# Patient Record
Sex: Male | Born: 1955 | Race: White | Hispanic: No | Smoking: Former smoker
Health system: Southern US, Community
[De-identification: ages and names within clinical notes are randomized; demographics above are authoritative.]

## PROBLEM LIST (undated history)

## (undated) DIAGNOSIS — M109 Gout, unspecified: Secondary | ICD-10-CM

## (undated) DIAGNOSIS — C439 Malignant melanoma of skin, unspecified: Secondary | ICD-10-CM

## (undated) DIAGNOSIS — K219 Gastro-esophageal reflux disease without esophagitis: Secondary | ICD-10-CM

## (undated) DIAGNOSIS — I10 Essential (primary) hypertension: Secondary | ICD-10-CM

## (undated) HISTORY — DX: Malignant melanoma of skin, unspecified: C43.9

## (undated) HISTORY — DX: Essential (primary) hypertension: I10

## (undated) HISTORY — DX: Gout, unspecified: M10.9

---

## 2008-05-14 ENCOUNTER — Ambulatory Visit: Payer: Self-pay | Admitting: Gastroenterology

## 2009-02-05 ENCOUNTER — Ambulatory Visit: Payer: Self-pay | Admitting: Internal Medicine

## 2010-04-11 HISTORY — PX: UPPER GI ENDOSCOPY: SHX6162

## 2010-04-11 HISTORY — PX: COLONOSCOPY: SHX174

## 2010-11-22 ENCOUNTER — Ambulatory Visit: Payer: Self-pay

## 2011-06-08 ENCOUNTER — Ambulatory Visit: Payer: Self-pay | Admitting: Gastroenterology

## 2011-06-10 LAB — PATHOLOGY REPORT

## 2012-02-06 ENCOUNTER — Ambulatory Visit: Payer: Self-pay | Admitting: Internal Medicine

## 2012-04-02 ENCOUNTER — Ambulatory Visit: Payer: Self-pay | Admitting: Podiatry

## 2014-03-17 ENCOUNTER — Ambulatory Visit: Payer: Self-pay | Admitting: Family Medicine

## 2014-04-11 DIAGNOSIS — C439 Malignant melanoma of skin, unspecified: Secondary | ICD-10-CM

## 2014-04-11 HISTORY — DX: Malignant melanoma of skin, unspecified: C43.9

## 2014-04-11 HISTORY — PX: MELANOMA EXCISION: SHX5266

## 2015-07-22 ENCOUNTER — Other Ambulatory Visit (INDEPENDENT_AMBULATORY_CARE_PROVIDER_SITE_OTHER): Payer: PRIVATE HEALTH INSURANCE

## 2015-07-22 ENCOUNTER — Ambulatory Visit (INDEPENDENT_AMBULATORY_CARE_PROVIDER_SITE_OTHER): Payer: PRIVATE HEALTH INSURANCE | Admitting: Family Medicine

## 2015-07-22 ENCOUNTER — Encounter: Payer: Self-pay | Admitting: Family Medicine

## 2015-07-22 VITALS — BP 136/82 | HR 72 | Wt 201.0 lb

## 2015-07-22 DIAGNOSIS — S86001A Unspecified injury of right Achilles tendon, initial encounter: Secondary | ICD-10-CM

## 2015-07-22 DIAGNOSIS — M1A0710 Idiopathic chronic gout, right ankle and foot, without tophus (tophi): Secondary | ICD-10-CM | POA: Diagnosis not present

## 2015-07-22 DIAGNOSIS — M109 Gout, unspecified: Secondary | ICD-10-CM | POA: Insufficient documentation

## 2015-07-22 NOTE — Assessment & Plan Note (Signed)
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 Patient has made improvement since he's got up on his allopurinol to 300 mg. Encourage him to continue do this. Has colchicine for breakthrough. But given trial topical anti-inflammatory. Discussed the importance of the heel lift. Exercise prescription given to increase activity slowly. Follow up in 4 weeks to re-ultrasound to make sure completely resolved.

## 2015-07-22 NOTE — Patient Instructions (Signed)
Good to see yo u Sorry for the bad news but I do think you will get better Heel lift could still help Give 2 weeks more on staying off the treadmill then start 1 time a week for 2 weeks then increase slowly.  Watch the diet a little Vitamin D 2000 iU daily  Turmeric 500mg  twice daily  See me again in 4 weeks if not perfect

## 2015-07-22 NOTE — Progress Notes (Signed)
[name] [address] [place], [place] [address] Phone: [phone] Subjective:      CC: right ankle pain  [id number] Dwayne Hunt is a 60 y.o. male coming in with complaint of right ankle pain. Has had this for greater than 8 months. Has been seen in formal physical therapy 2 times, Stella walking boot, prednisone, and started on allopurinol recently. Has been titrated up on the allopurinol and is finally noticing some mild improvement. Patient states that the physical therapy did not help. Would have redness and warmth to the back of the heel. Even at times but keep him from walking. Rate in the severity of pain initially as 9 out of 10. Now more of 4 out of 10. Patient is an avid bowler and is looking to start going on a regular basis again. Has not responded to ice or heat. Now though able to do daily activities more regularly.     No past medical history on file.history of gout No past surgical history on file. Social History   Social History  . Marital Status: Divorced    Spouse Name: N/A  . Number of Children: N/A  . Years of Education: N/A   Social History Main Topics  . Smoking status: Not on file  . Smokeless tobacco: Not on file  . Alcohol Use: Not on file  . Drug Use: Not on file  . Sexual Activity: Not on file   Other Topics Concern  . Not on file   Social History Narrative  . No narrative on file   Allergies not on fileno known drug allergies No family history on file.positive family history of polymyositis  Past medical history, social, surgical and family history all reviewed in electronic medical record.  No pertanent information unless stated regarding to the chief complaint.   Review of Systems: No headache, visual changes, nausea, vomiting, diarrhea, constipation, dizziness, abdominal pain, skin rash, fevers, chills, night sweats, weight loss, swollen lymph nodes, body aches, joint swelling, muscle aches, chest pain,  shortness of breath, mood changes.   Objective Blood pressure 136/82, pulse 72, weight 201 lb (91.173 kg).  General: No apparent distress alert and oriented x3 mood and affect normal, dressed appropriately.  HEENT: Pupils equal, extraocular movements intact  Respiratory: Patient's speak in full sentences and does not appear short of breath  Cardiovascular: No lower extremity edema, non tender, no erythema  Skin: Warm dry intact with no signs of infection or rash on extremities or on axial skeleton.  Abdomen: Soft nontender  Neuro: Cranial nerves II through XII are intact, neurovascularly intact in all extremities with 2+ DTRs and 2+ pulses.  Lymph: No lymphadenopathy of posterior or anterior cervical chain or axillae bilaterally.  Gait normal with good balance and coordination.  MSK:  Non tender with full range of motion and good stability and symmetric strength and tone of shoulders, elbows, wrist, hip, knees bilaterally.  Ankle:right No visible erythema or swelling. Range of motion is full in all directions. Strength is 5/5 in all directions. Stable lateral and medial ligaments; squeeze test and kleiger test unremarkable; Talar dome nontender; No pain at base of 5th MT; No tenderness over cuboid; No tenderness over N spot or navicular prominence No tenderness on posterior aspects of lateral and medial malleolus No sign of peroneal tendon subluxations or tenderness to palpation Mild tenderness to palpation over the insertion of the Achilles and the right side Negative tarsal tunnel tinel's Able to walk 4  steps.  MSK US performed of: right ankle This study was ordered, performed, and interpreted by Charlann Boxer D.O.  Foot/Ankle:   All structures visualized.   Talar dome unremarkable  Ankle mortise without effusion. Peroneus longus and brevis tendons unremarkable on long and transverse views without sheath effusions. Posterior tibialis, flexor hallucis longus, and flexor digitorum  longus tendons unremarkable on long and transverse views without sheath effusions. Achilles tendon visualized with no significant nodule noted. No significant increase in Doppler flow. Uric acid crystals noted at insertion. Anterior Talofibular Ligament and Calcaneofibular Ligaments unremarkable and intact. Deltoid Ligament unremarkable and intact. Plantar fascia intact and without effusion, normal thickness. No increased doppler signal, cap sign, or thickening of tibial cortex. Power doppler signal normal.  IMPRESSION: uric acid crystals in tendon sheath     Impression and Recommendations:     This case required medical decision making of moderate complexity.      Note: This dictation was prepared with Dragon dictation along with smaller phrase technology. Any transcriptional errors that result from this process are unintentional.

## 2015-08-18 ENCOUNTER — Encounter: Payer: Self-pay | Admitting: Family Medicine

## 2015-08-18 ENCOUNTER — Ambulatory Visit (INDEPENDENT_AMBULATORY_CARE_PROVIDER_SITE_OTHER): Payer: PRIVATE HEALTH INSURANCE | Admitting: Family Medicine

## 2015-08-18 VITALS — BP 120/80 | HR 60 | Wt 204.0 lb

## 2015-08-18 DIAGNOSIS — M1A0710 Idiopathic chronic gout, right ankle and foot, without tophus (tophi): Secondary | ICD-10-CM | POA: Diagnosis not present

## 2015-08-18 NOTE — Progress Notes (Signed)
Pre visit review using our clinic review tool, if applicable. No additional management support is needed unless otherwise documented below in the visit note. 

## 2015-08-18 NOTE — Progress Notes (Signed)
[facility] [address] [place], [place] [address] Phone: [phone] Subjective:      CC: right ankle pain f/u  [id number] Dwayne Hunt is a 60 y.o. male coming in with complaint of right ankle pain. This was found to have gout. Since he's been on the allopurinol significant a better. Patient went on a trip to [place] and did do a little alcohol and seafood which did cause a flare in his left ankle. Overall though not as severe as what is been. As long as he takes his medicine in the colchicine on an as-needed basis seems to be doing well. Continues to heal this. Starting to increase his activity at the gym as well.     No past medical history on file.history of gout No past surgical history on file. Social History   Social History  . Marital Status: Divorced    Spouse Name: N/A  . Number of Children: N/A  . Years of Education: N/A   Social History Main Topics  . Smoking status: Unknown If Ever Smoked  . Smokeless tobacco: None  . Alcohol Use: None  . Drug Use: None  . Sexual Activity: Not Asked   Other Topics Concern  . None   Social History Narrative   Not on [place] known drug allergies No family history on file.positive family history of polymyositis  Past medical history, social, surgical and family history all reviewed in electronic medical record.  No pertanent information unless stated regarding to the chief complaint.   Review of Systems: No headache, visual changes, nausea, vomiting, diarrhea, constipation, dizziness, abdominal pain, skin rash, fevers, chills, night sweats, weight loss, swollen lymph nodes, body aches, joint swelling, muscle aches, chest pain, shortness of breath, mood changes.   Objective Blood pressure 120/80, pulse 60, weight 204 lb (92.534 kg), SpO2 98 %.  General: No apparent distress alert and oriented x3 mood and affect normal, dressed appropriately.  HEENT: Pupils equal, extraocular movements intact    Respiratory: Patient's speak in full sentences and does not appear short of breath  Cardiovascular: No lower extremity edema, non tender, no erythema  Skin: Warm dry intact with no signs of infection or rash on extremities or on axial skeleton.  Abdomen: Soft nontender  Neuro: Cranial nerves II through XII are intact, neurovascularly intact in all extremities with 2+ DTRs and 2+ pulses.  Lymph: No lymphadenopathy of posterior or anterior cervical chain or axillae bilaterally.  Gait normal with good balance and coordination.  MSK:  Non tender with full range of motion and good stability and symmetric strength and tone of shoulders, elbows, wrist, hip, knees bilaterally.  Ankle:right No visible erythema or swelling. Range of motion is full in all directions. Strength is 5/5 in all directions. Stable lateral and medial ligaments; squeeze test and kleiger test unremarkable; Talar dome nontender; No pain at base of 5th MT; No tenderness over cuboid; No tenderness over N spot or navicular prominence No tenderness on posterior aspects of lateral and medial malleolus No sign of peroneal tendon subluxations or tenderness to palpation Minimal tenderness to palpation of the Achilles tendon bilaterally Negative tarsal tunnel tinel's Able to walk 4 steps.     Impression and Recommendations:     This case required medical decision making of moderate complexity.      Note: This dictation was prepared with Dragon dictation along with smaller phrase technology. Any transcriptional errors that result from this process are unintentional.

## 2015-08-18 NOTE — Assessment & Plan Note (Signed)
Patient did have improvement. Patient has anti-inflammatories for breakthrough pain. Continue allopurinol daily and we will refill as needed. We discussed the colchicine for breakthrough. Patient will see me again on an as-needed basis.

## 2015-08-18 NOTE — Patient Instructions (Signed)
Verbal instructions given

## 2015-09-17 DIAGNOSIS — N138 Other obstructive and reflux uropathy: Secondary | ICD-10-CM | POA: Insufficient documentation

## 2015-09-17 DIAGNOSIS — R7989 Other specified abnormal findings of blood chemistry: Secondary | ICD-10-CM | POA: Insufficient documentation

## 2015-09-17 DIAGNOSIS — N401 Enlarged prostate with lower urinary tract symptoms: Secondary | ICD-10-CM

## 2015-09-17 DIAGNOSIS — N5203 Combined arterial insufficiency and corporo-venous occlusive erectile dysfunction: Secondary | ICD-10-CM | POA: Insufficient documentation

## 2015-10-30 ENCOUNTER — Ambulatory Visit: Payer: PRIVATE HEALTH INSURANCE | Admitting: Family Medicine

## 2015-11-03 NOTE — Progress Notes (Signed)
[name] [place], [place] [address] Phone: [phone] Subjective:      CC: right ankle pain f/u  [id number]  Dwayne Hunt is a 60 y.o. male coming in with complaint of right ankle pain. This was found to have gout. Since he's been on the allopurinol significant a better. Patient was on allopurinol 3 mg. Patient was to do home exercises, bracing and we also discussed proper shoes. Patient states still has some mild pain bilaterally. Patient states that he can only wear his Platelets. Continues to try to increase his activity but is unable to do so. Does bike as well as elliptical without any significant pain.     No past medical history on file.history of gout No past surgical history on file. Social History   Social History  . Marital status: Divorced    Spouse name: N/A  . Number of children: N/A  . Years of education: N/A   Social History Main Topics  . Smoking status: Unknown If Ever Smoked  . Smokeless tobacco: None  . Alcohol use None  . Drug use: Unknown  . Sexual activity: Not Asked   Other Topics Concern  . None   Social History Narrative  . None   Not on [place] known drug allergies No family history on file.positive family history of polymyositis  Past medical history, social, surgical and family history all reviewed in electronic medical record.  No pertanent information unless stated regarding to the chief complaint.   Review of Systems: No headache, visual changes, nausea, vomiting, diarrhea, constipation, dizziness, abdominal pain, skin rash, fevers, chills, night sweats, weight loss, swollen lymph nodes, body aches, joint swelling, muscle aches, chest pain, shortness of breath, mood changes.   Objective  Blood pressure 122/82, pulse 64, height 6' (1.829 m), weight 203 lb (92.1 kg), SpO2 97 %.  General: No apparent distress alert and oriented x3 mood and affect normal, dressed appropriately.  HEENT:  Pupils equal, extraocular movements intact  Respiratory: Patient's speak in full sentences and does not appear short of breath  Cardiovascular: No lower extremity edema, non tender, no erythema  Skin: Warm dry intact with no signs of infection or rash on extremities or on axial skeleton.  Abdomen: Soft nontender  Neuro: Cranial nerves II through XII are intact, neurovascularly intact in all extremities with 2+ DTRs and 2+ pulses.  Lymph: No lymphadenopathy of posterior or anterior cervical chain or axillae bilaterally.  Gait normal with good balance and coordination.  MSK:  Non tender with full range of motion and good stability and symmetric strength and tone of shoulders, elbows, wrist, hip, knees bilaterally.  Ankle:right No visible erythema or swelling. Range of motion is full in all directions. Strength is 5/5 in all directions. Stable lateral and medial ligaments; squeeze test and kleiger test unremarkable; Talar dome nontender; No pain at base of 5th MT; No tenderness over cuboid; No tenderness over N spot or navicular prominence No tenderness on posterior aspects of lateral and medial malleolus No sign of peroneal tendon subluxations or tenderness to palpation Minimal tenderness to palpation of the Achilles tendon bilaterally, patient has mild tenderness over the left Achilles as well Negative tarsal tunnel tinel's Able to walk 4 steps.  Limited muscular skeletal ultrasound was performed and interpreted by Hulan [name], M  Limited ultrasound shows the patient still has some calcific first uric acid deposits that the Achilles tendon and its insertion. Still significantly better than previously.  Impression and Recommendations:     This case required medical decision making of moderate complexity.      Note: This dictation was prepared with Dragon dictation along with smaller phrase technology. Any transcriptional errors that result from this process are unintentional.

## 2015-11-03 NOTE — Assessment & Plan Note (Addendum)
Patient's has had difficulty with gout. Is on allopurinol. Most recent ultrasound shows patient still has some mild uric acid deposits noted on ultrasound. Patient will be continuing on the allopurinol we will add colchicine 0.6 mg daily. We discussed with patient about vitamin D supplementation. Patient will do once weekly for the next 12 weeks. We discussed icing regimen. Patient will try to make these changes as well as a heel lift in his shoe. Patient come back and see me again in 4 weeks. If continuing have difficulty we'll consider formal physical therapy as well as consider nitroglycerin patches patient would have to come off his Cialis.

## 2015-11-04 ENCOUNTER — Ambulatory Visit (INDEPENDENT_AMBULATORY_CARE_PROVIDER_SITE_OTHER): Payer: PRIVATE HEALTH INSURANCE | Admitting: Family Medicine

## 2015-11-04 ENCOUNTER — Other Ambulatory Visit: Payer: Self-pay

## 2015-11-04 ENCOUNTER — Encounter: Payer: Self-pay | Admitting: Family Medicine

## 2015-11-04 DIAGNOSIS — M1A0710 Idiopathic chronic gout, right ankle and foot, without tophus (tophi): Secondary | ICD-10-CM | POA: Diagnosis not present

## 2015-11-04 MED ORDER — VITAMIN D (ERGOCALCIFEROL) 1.25 MG (50000 UNIT) PO CAPS
50000.0000 [IU] | ORAL_CAPSULE | ORAL | 0 refills | Status: DC
Start: 2015-11-04 — End: 2016-02-24

## 2015-11-04 NOTE — Patient Instructions (Addendum)
Good to see you  Ice is your friend [email or web address] 1/8 inch or 1/4 inch heel lift would be great  Once weekly vitamin D  Colchicine 1 time daily always now Continue the allopurinol Push it in week 3  TENS unit 20 minutes 1 time daily at least  See me again in 3-4 weeks. If not a lot better we will consider PT and nitro patch

## 2015-12-01 NOTE — Progress Notes (Signed)
[facility] [place], [place] [address] Phone: [phone] Subjective:    CC: right ankle pain f/u  [id number]  Dwayne Hunt is a 60 y.o. male coming in with complaint of right ankle pain. This was found to have gout. Since he's been on the allopurinol significant a better. Patient was on allopurinol 300 mg. Patient was to do home exercises, bracing and we also discussed proper shoes.  Patient was making improvement. Patient was to do heel lift, once weekly vitamin D, colchicine regularly and start increasing activity. Patient states He is doing 90% better. Still some mild discomfort at the back of the Achilles but nothing severe. Patient states that overall doing much better. Patient states that when on a 15 mile bike ride and did do well    No past medical history on file.history of gout No past surgical history on file. Social History   Social History  . Marital status: Divorced    Spouse name: N/A  . Number of children: N/A  . Years of education: N/A   Social History Main Topics  . Smoking status: Unknown If Ever Smoked  . Smokeless tobacco: None  . Alcohol use None  . Drug use: Unknown  . Sexual activity: Not Asked   Other Topics Concern  . None   Social History Narrative  . None   Not on [place] known drug allergies No family history on file.positive family history of polymyositis  Past medical history, social, surgical and family history all reviewed in electronic medical record.  No pertanent information unless stated regarding to the chief complaint.   Review of Systems: No headache, visual changes, nausea, vomiting, diarrhea, constipation, dizziness, abdominal pain, skin rash, fevers, chills, night sweats, weight loss, swollen lymph nodes, body aches, joint swelling, muscle aches, chest pain, shortness of breath, mood changes.   Objective  Blood pressure 118/82, pulse 60, weight 202 lb (91.6 kg), SpO2 98 %.  General:  No apparent distress alert and oriented x3 mood and affect normal, dressed appropriately.  HEENT: Pupils equal, extraocular movements intact  Respiratory: Patient's speak in full sentences and does not appear short of breath  Cardiovascular: No lower extremity edema, non tender, no erythema  Skin: Warm dry intact with no signs of infection or rash on extremities or on axial skeleton.  Abdomen: Soft nontender  Neuro: Cranial nerves II through XII are intact, neurovascularly intact in all extremities with 2+ DTRs and 2+ pulses.  Lymph: No lymphadenopathy of posterior or anterior cervical chain or axillae bilaterally.  Gait normal with good balance and coordination.  MSK:  Non tender with full range of motion and good stability and symmetric strength and tone of shoulders, elbows, wrist, hip, knees bilaterally.  Ankle:right No visible erythema or swelling. Range of motion is full in all directions. Strength is 5/5 in all directions. Stable lateral and medial ligaments; squeeze test and kleiger test unremarkable; Talar dome nontender; No pain at base of 5th MT; No tenderness over cuboid; No tenderness over N spot or navicular prominence No tenderness on posterior aspects of lateral and medial malleolus No sign of peroneal tendon subluxations or tenderness to palpation Nontender over the Achilles. Still some mild tightness of the Achilles bilaterally. Negative tarsal tunnel tinel's Able to walk 4 steps.     Impression and Recommendations:     This case required medical decision making of moderate complexity.      Note: This dictation was prepared with Dragon dictation  along with smaller phrase technology. Any transcriptional errors that result from this process are unintentional.

## 2015-12-02 ENCOUNTER — Encounter: Payer: Self-pay | Admitting: Family Medicine

## 2015-12-02 ENCOUNTER — Ambulatory Visit (INDEPENDENT_AMBULATORY_CARE_PROVIDER_SITE_OTHER): Payer: PRIVATE HEALTH INSURANCE | Admitting: Family Medicine

## 2015-12-02 DIAGNOSIS — M1A0710 Idiopathic chronic gout, right ankle and foot, without tophus (tophi): Secondary | ICD-10-CM | POA: Diagnosis not present

## 2015-12-02 NOTE — Patient Instructions (Signed)
Great to see you  Dwayne Hunt is your friend Keep it up  See me again in 6-8 weeks.

## 2015-12-02 NOTE — Assessment & Plan Note (Signed)
Achilles tendinosis bilaterally. Has made significant progress. Did have some spurring of the left Achilles on ultrasound. We discussed with patient to continue the once weekly vitamin D, the other medications, and patient will follow-up on an as-needed basis. If worsening symptoms again consider injection but patient and will have to decrease activity significantly. Patient understands this and will follow-up as needed.

## 2016-01-20 ENCOUNTER — Ambulatory Visit: Payer: PRIVATE HEALTH INSURANCE | Admitting: Family Medicine

## 2016-02-10 HISTORY — PX: BIOPSY SHOULDER: PRO31

## 2016-02-24 ENCOUNTER — Inpatient Hospital Stay: Payer: Self-pay

## 2016-02-24 ENCOUNTER — Ambulatory Visit (INDEPENDENT_AMBULATORY_CARE_PROVIDER_SITE_OTHER): Payer: PRIVATE HEALTH INSURANCE | Admitting: General Surgery

## 2016-02-24 ENCOUNTER — Encounter: Payer: Self-pay | Admitting: General Surgery

## 2016-02-24 VITALS — BP 128/74 | HR 62 | Resp 12 | Ht 72.0 in | Wt 195.0 lb

## 2016-02-24 DIAGNOSIS — Z85820 Personal history of malignant melanoma of skin: Secondary | ICD-10-CM

## 2016-02-24 DIAGNOSIS — C4361 Malignant melanoma of right upper limb, including shoulder: Secondary | ICD-10-CM | POA: Diagnosis not present

## 2016-02-24 DIAGNOSIS — C439 Malignant melanoma of skin, unspecified: Secondary | ICD-10-CM

## 2016-02-24 NOTE — Progress Notes (Addendum)
Patient ID: Dwayne Hunt, male   DOB: [birth year], 60 y.o.   MRN: [id number]  Chief Complaint  Patient presents with  . Cancer    melanoma    HPI Dwayne Hunt is a 60 y.o. male.  Here today for evaluation of melanoma of the right anterior shoulder. The biopsy was 02-10-16. He states he had melanoma on his left posterior shoulder last year as well. He works for [employer].  He has plans to go to [place] on vacation the near future.   The patient has previously had a melanoma excised from the left superior shoulder. He is examined every 6 months by the dermatology service.   He states his father passed away last month with chronic lymphocytic leukemia. . I personally reviewed the patient's history. HPI  Past Medical History:  Diagnosis Date  . Gout   . Hypertension   . Melanoma ([place]) 2016   left shoulder    Past Surgical History:  Procedure Laterality Date  . BIOPSY SHOULDER Right 02/10/2016   invasive melanoma  . COLONOSCOPY  2012  . MELANOMA EXCISION Left 2016   Dr Aubery Lapping  . UPPER GI ENDOSCOPY  2012    Family History  Problem Relation Age of Onset  . Ovarian cancer Mother   . Leukemia Father     Social History Social History  Substance Use Topics  . Smoking status: Former Smoker    Years: 10.00    Types: Cigarettes    Quit date: 04/11/2005  . Smokeless tobacco: Never Used  . Alcohol use 0.0 oz/week     Comment: 2/day    No Known Allergies  Current Outpatient Prescriptions  Medication Sig Dispense Refill  . allopurinol (ZYLOPRIM) 300 MG tablet Take 300 mg by mouth daily.  12  . ALPRAZolam (XANAX) 1 MG tablet Take 0.5 mg by mouth at bedtime as needed for anxiety.    . colchicine 0.6 MG tablet Take 0.6 mg by mouth as needed.    [name] (DEXILANT) 60 MG capsule Take by mouth.    [name] (PRINIVIL,ZESTRIL) 20 MG tablet Take 20 mg by mouth daily.  4  . tadalafil (CIALIS) 20 MG tablet     . zolpidem (AMBIEN) 10 MG tablet Take 10 mg by mouth  at bedtime as needed.  2   No current facility-administered medications for this visit.     Review of Systems Review of Systems  Constitutional: Negative.   Respiratory: Negative.   Cardiovascular: Negative.     Blood pressure 128/74, pulse 62, resp. rate 12, height 6' (1.829 m), weight 195 lb (88.5 kg), SpO2 95 %.  Physical Exam Physical Exam  Constitutional: He is oriented to person, place, and time. He appears well-developed and well-nourished.  HENT:  Mouth/Throat: Oropharynx is clear and moist.  Eyes: Conjunctivae are normal. No scleral icterus.  Neck: Neck supple.  Cardiovascular: Normal rate, regular rhythm and normal heart sounds.   Pulmonary/Chest: Effort normal and breath sounds normal.    Lymphadenopathy:    He has no cervical adenopathy.    He has axillary adenopathy (moderate enlargement without tenderness of the right axillary nodes is appreciated.).  Right axillary node  Neurological: He is alert and oriented to person, place, and time.  Skin: Skin is warm and dry.  Psychiatric: His behavior is normal.    Data Reviewed Dermatology notes of 02/10/2016 were reviewed.  Pathology from biopsy of the right anterior shoulder lesion showed an invasive malignant melanoma bras low thickness 0.29 mm.  No ulceration, mitoses or vascular invasion. Edges were involved with melanoma in situ. PT 1 a  The biopsy showed showed extensive melanoma in situ with rare superficial dermal component. Maximum thickness 0.37 mm.   Examination of the Axilla Was Completed Due To the Palpable Adenopathy. Multiple Moderately Enlarged Lymph Nodes Measuring up to 2.5 Cm in Diameter Were Noted. Normal Architecture Was Preserved. Likely Reactive Secondary to the Biopsy on the Shoulder As Well As the Right Inframammary Lesion. Contralateral Examination Showed Moderately Prominent Lymph Nodes, Slightly Smaller Than Those Noted on the Right Side.  Assessment    Early-stage malignant melanoma,  less than 0.4 mm.    Plan    The patient is a candidate for wide excision. Sentinel node biopsy is not indicated. He reports the extensive bleeding during the left shoulder excision.  We'll arrange for office excision after his return from his vacation in [place].     Recommend complete excision of the melanoma in the office after his trip to [place].   This information has been scribed by [name], BSN,BC.   [name] 02/24/2016, 7:56 PM

## 2016-02-24 NOTE — Patient Instructions (Addendum)
The patient is aware to call back for any questions or concerns. Recommend complete excision of the melanoma in the office after his trip to [place]

## 2016-03-22 ENCOUNTER — Ambulatory Visit (INDEPENDENT_AMBULATORY_CARE_PROVIDER_SITE_OTHER): Payer: PRIVATE HEALTH INSURANCE | Admitting: General Surgery

## 2016-03-22 ENCOUNTER — Encounter: Payer: Self-pay | Admitting: General Surgery

## 2016-03-22 VITALS — BP 120/70 | HR 60 | Resp 12 | Ht 72.0 in | Wt 199.0 lb

## 2016-03-22 DIAGNOSIS — C439 Malignant melanoma of skin, unspecified: Secondary | ICD-10-CM | POA: Diagnosis not present

## 2016-03-22 MED ORDER — HYDROCODONE-ACETAMINOPHEN 5-325 MG PO TABS: 1.0000 | ORAL_TABLET | ORAL | 0 refills | Status: DC | PRN

## 2016-03-22 NOTE — Patient Instructions (Addendum)
The patient is aware to call back for any questions or concerns. Keep area clean May shower  Use Ice pack for comfort today Return for suture removal

## 2016-03-22 NOTE — Progress Notes (Signed)
Dictation #[id number]  [id number] Patient ID: Dwayne Hunt, male   DOB: 12/06/1955, 60 y.o.   MRN: [id number]  Chief Complaint  Patient presents with  . Procedure    HPI Dwayne Hunt is a 60 y.o. male here today for a right shoulder melanoma wide excision. He does admit to head congestion.  HPI  Past Medical History:  Diagnosis Date  . Gout   . Hypertension   . Melanoma ([place]) 2016   left shoulder    Past Surgical History:  Procedure Laterality Date  . BIOPSY SHOULDER Right 02/10/2016   invasive melanoma  . COLONOSCOPY  2012  . MELANOMA EXCISION Left 2016   Dr Aubery Lapping  . UPPER GI ENDOSCOPY  2012    Family History  Problem Relation Age of Onset  . Ovarian cancer Mother   . Leukemia Father     Social History Social History  Substance Use Topics  . Smoking status: Former Smoker    Years: 10.00    Types: Cigarettes    Quit date: 04/11/2005  . Smokeless tobacco: Never Used  . Alcohol use 0.0 oz/week     Comment: 2/day    No Known Allergies  Current Outpatient Prescriptions  Medication Sig Dispense Refill  . allopurinol (ZYLOPRIM) 300 MG tablet Take 300 mg by mouth daily.  12  . ALPRAZolam (XANAX) 1 MG tablet Take 0.5 mg by mouth at bedtime as needed for anxiety.    . colchicine 0.6 MG tablet Take 0.6 mg by mouth as needed.    [name] (DEXILANT) 60 MG capsule Take by mouth.    Marland Kitchen HYDROcodone-acetaminophen (NORCO) 5-325 MG tablet Take 1-2 tablets by mouth every 4 (four) hours as needed for moderate pain. 20 tablet 0  . lisinopril (PRINIVIL,ZESTRIL) 20 MG tablet Take 20 mg by mouth daily.  4  . tadalafil (CIALIS) 20 MG tablet     . zolpidem (AMBIEN) 10 MG tablet Take 10 mg by mouth at bedtime as needed.  2   No current facility-administered medications for this visit.     Review of Systems Review of Systems  Constitutional: Negative.   Respiratory: Negative.   Cardiovascular: Negative.     Blood pressure 120/70, pulse 60, resp. rate  12, height 6' (1.829 m), weight 199 lb (90.3 kg).  Physical Exam Physical Exam  Constitutional: He is oriented to person, place, and time. He appears well-developed and well-nourished.  Musculoskeletal:       Arms: Neurological: He is alert and oriented to person, place, and time.  Skin: Skin is warm and dry.       Assessment    Candidate for wide local excision.    Plan    20 mL of 0.5% Xylocaine with 0.25% Marcaine with 1-200,000 units was supplemented with 3 mL of 1% plain Xylocaine. The area was prepped with ChloraPrep and draped. 1 cm margins were marked out and the total diameter of the resected area was 3 cm. A 6 cm long elliptical incision was made this was carried out through the skin and subcutaneous tissue. A nylon/Prolene suture was placed at the superior aspect of the ellipse to mark the 12:00 position. This was a transversely oriented incision. Scant bleeding was noted. The deep tissue was approximated with interrupted 3-0 Vicryl figure-of-eight sutures. A more superficial layer of similar 3-0 Vicryl simple sutures at the deep dermal layer replaced. The skin was then approximated with interrupted 4-0 simple sutures. Telfa and taken dressing applied.  The patient is  not particularly active, but as this excision takes place near the shoulder joint we'll leave the sutures in a longer than usual.     Return for suture removal (every other suture first week)  This information has been scribed by Gaspar Cola CMA.  [name] 03/22/2016, 7:00 PM

## 2016-03-28 ENCOUNTER — Telehealth: Payer: Self-pay

## 2016-03-28 NOTE — Telephone Encounter (Signed)
-----   Message from [name], MD sent at 03/26/2016  9:57 AM EST ----- I would like to see the patient in about three weeks. He will be coming back for suture removal before that.  ----- Message ----- From: Interface, Lab In [facility] Sent: 03/25/2016   7:54 PM To: [name], MD

## 2016-03-28 NOTE — Telephone Encounter (Signed)
Message left for patient to call and schedule a 3 week follow up with Dr Bary Castilla.

## 2016-03-28 NOTE — Telephone Encounter (Signed)
Patient scheduled to follow up in 3 weeks.

## 2016-03-31 ENCOUNTER — Ambulatory Visit (INDEPENDENT_AMBULATORY_CARE_PROVIDER_SITE_OTHER): Payer: PRIVATE HEALTH INSURANCE | Admitting: *Deleted

## 2016-03-31 DIAGNOSIS — C439 Malignant melanoma of skin, unspecified: Secondary | ICD-10-CM

## 2016-03-31 NOTE — Progress Notes (Signed)
Patient ID: Dwayne Hunt, male   DOB: [birth year], 60 y.o.   MRN: [id number]  Patient came in today for a wound check. Every other suture removed. Steri strip applied. Aware of pathology.  The wound is clean, with no signs of infection noted. Appointment made for Jan 2 to complete suture removal.

## 2016-03-31 NOTE — Patient Instructions (Signed)
The patient is aware to call back for any questions or concerns.  

## 2016-04-12 ENCOUNTER — Ambulatory Visit (INDEPENDENT_AMBULATORY_CARE_PROVIDER_SITE_OTHER): Payer: PRIVATE HEALTH INSURANCE | Admitting: *Deleted

## 2016-04-12 DIAGNOSIS — C439 Malignant melanoma of skin, unspecified: Secondary | ICD-10-CM

## 2016-04-12 NOTE — Patient Instructions (Signed)
The patient is aware to call back for any questions or concerns.  

## 2016-04-12 NOTE — Progress Notes (Signed)
Patient ID: Dwayne Hunt, male   DOB: Feb 28, 1956, 61 y.o.   MRN: [id number]  Patient came in today for a wound check. The remaining sutures were removed and steri strips applied.  The wound is clean, with no signs of infection noted. Follow up as scheduled.

## 2016-04-16 ENCOUNTER — Encounter: Payer: Self-pay | Admitting: Emergency Medicine

## 2016-04-16 ENCOUNTER — Ambulatory Visit
Admission: EM | Admit: 2016-04-16 | Discharge: 2016-04-16 | Disposition: A | Discharge status: Home / Self Care | Payer: PRIVATE HEALTH INSURANCE | Attending: Emergency Medicine | Admitting: Emergency Medicine

## 2016-04-16 DIAGNOSIS — R69 Illness, unspecified: Secondary | ICD-10-CM | POA: Diagnosis not present

## 2016-04-16 DIAGNOSIS — R05 Cough: Secondary | ICD-10-CM | POA: Diagnosis not present

## 2016-04-16 DIAGNOSIS — R0981 Nasal congestion: Secondary | ICD-10-CM

## 2016-04-16 DIAGNOSIS — R51 Headache: Secondary | ICD-10-CM | POA: Diagnosis not present

## 2016-04-16 DIAGNOSIS — J111 Influenza due to unidentified influenza virus with other respiratory manifestations: Secondary | ICD-10-CM

## 2016-04-16 LAB — RAPID INFLUENZA A&B ANTIGENS
Influenza A (ARMC): NEGATIVE
Influenza B (ARMC): NEGATIVE

## 2016-04-16 MED ORDER — FLUTICASONE PROPIONATE 50 MCG/ACT NA SUSP: 2.0000 | Freq: Every day | NASAL | 0 refills | Status: DC

## 2016-04-16 MED ORDER — OSELTAMIVIR PHOSPHATE 75 MG PO CAPS: 75.0000 mg | ORAL_CAPSULE | Freq: Two times a day (BID) | ORAL | 0 refills | Status: DC

## 2016-04-16 MED ORDER — HYDROCOD POLST-CPM POLST ER 10-8 MG/5ML PO SUER
5.0000 mL | Freq: Two times a day (BID) | ORAL | 0 refills | Status: DC | PRN
Start: 2016-04-16 — End: 2017-01-26

## 2016-04-16 MED ORDER — IBUPROFEN 800 MG PO TABS: 800.0000 mg | ORAL_TABLET | Freq: Three times a day (TID) | ORAL | 0 refills | Status: DC

## 2016-04-16 MED ORDER — BENZONATATE 200 MG PO CAPS: 200.0000 mg | ORAL_CAPSULE | Freq: Three times a day (TID) | ORAL | 0 refills | Status: DC | PRN

## 2016-04-16 MED ORDER — ACETAMINOPHEN 500 MG PO TABS
1000.0000 mg | ORAL_TABLET | Freq: Once | ORAL | Status: AC
  Administered 2016-04-16: 1000 mg via ORAL

## 2016-04-16 NOTE — ED Triage Notes (Signed)
Cough, headache, fever, eye pain since yesterday

## 2016-04-16 NOTE — Discharge Instructions (Signed)
Start saline nasal irrigation with a neti pot, regular Mucinex, Tussionex and Tessalon Perles.  start Flonase, ibuprofen 800 mg 1 g of Tylenol 3 times a day. Finish the Tamiflu.

## 2016-04-16 NOTE — ED Provider Notes (Signed)
HPI  SUBJECTIVE:  Dwayne Hunt is a 61 y.o. male who presents with acute onset of greenish yellowish nasal congestion and rhinorrhea, postnasal drip, ear pain, nonproductive cough,  headaches, bodyaches, "pressure behind my eyes" and fevers Tmax 100.6 starting yesterday. He reports mild photophobia. He tried Robitussin and NyQuil which helped his symptoms. Standing also helps with his cough, and symptoms are worse with sitting. He denies sore throat, voice changes, sinus pain or pressure, dental pain, facial swelling, nausea, vomiting, diarrhea. No neck stiffness, rash, wheezing, chest pain, shortness of breath. No abdominal pain, urinary complaints, skin infection. No visual changes. He states that he has multiple coworkers with similar symptoms. He did get a flu shot this year. He has a past medical history of sinusitis. He is a former smoker. Also history of hypertension for which she takes an ACE inhibitor, gout. No history of diabetes, GI bleed, kidney disease, immunocompromise. [id number] C, MD    Past Medical History:  Diagnosis Date  . Gout   . Hypertension   . Melanoma ([place]) 2016   left shoulder    Past Surgical History:  Procedure Laterality Date  . BIOPSY SHOULDER Right 02/10/2016   invasive melanoma  . COLONOSCOPY  2012  . MELANOMA EXCISION Left 2016   Dr Aubery Lapping  . UPPER GI ENDOSCOPY  2012    Family History  Problem Relation Age of Onset  . Ovarian cancer Mother   . Leukemia Father     Social History  Substance Use Topics  . Smoking status: Former Smoker    Years: 10.00    Types: Cigarettes    Quit date: 04/11/2005  . Smokeless tobacco: Never Used  . Alcohol use 0.0 oz/week     Comment: 2/day    No current facility-administered medications for this encounter.   Current Outpatient Prescriptions:  .  allopurinol (ZYLOPRIM) 300 MG tablet, Take 300 mg by mouth daily., Disp: , Rfl: 12 .  ALPRAZolam (XANAX) 1 MG tablet, Take 0.5 mg by mouth at bedtime  as needed for anxiety., Disp: , Rfl:  .  colchicine 0.6 MG tablet, Take 0.6 mg by mouth as needed., Disp: , Rfl:  .  dexlansoprazole (DEXILANT) 60 MG capsule, Take by mouth., Disp: , Rfl:  .  lisinopril (PRINIVIL,ZESTRIL) 20 MG tablet, Take 20 mg by mouth daily., Disp: , Rfl: 4 .  tadalafil (CIALIS) 20 MG tablet, , Disp: , Rfl:  .  zolpidem (AMBIEN) 10 MG tablet, Take 10 mg by mouth at bedtime as needed., Disp: , Rfl: 2 .  benzonatate (TESSALON) 200 MG capsule, Take 1 capsule (200 mg total) by mouth 3 (three) times daily as needed for cough., Disp: 20 capsule, Rfl: 0 .  chlorpheniramine-HYDROcodone (TUSSIONEX PENNKINETIC ER) 10-8 MG/5ML SUER, Take 5 mLs by mouth every 12 (twelve) hours as needed for cough., Disp: 120 mL, Rfl: 0 .  fluticasone (FLONASE) 50 MCG/ACT nasal spray, Place 2 sprays into both nostrils daily., Disp: 16 g, Rfl: 0 .  ibuprofen (ADVIL,MOTRIN) 800 MG tablet, Take 1 tablet (800 mg total) by mouth 3 (three) times daily., Disp: 30 tablet, Rfl: 0 .  oseltamivir (TAMIFLU) 75 MG capsule, Take 1 capsule (75 mg total) by mouth 2 (two) times daily. X 5 days, Disp: 10 capsule, Rfl: 0  No Known Allergies   ROS  As noted in HPI.   Physical Exam  BP 129/82 (BP Location: Left Arm)   Pulse 94   Temp (!) 100.4 F (38 C) (Tympanic)   Resp  18   Ht 6' (1.829 m)   Wt 195 lb (88.5 kg)   SpO2 97%   BMI 26.45 kg/m   Constitutional: Well developed, well nourished, no acute distress Eyes:  EOMI, conjunctiva normal bilaterally HENT: Normocephalic, atraumatic,mucus membranes moist. TMs normal bilaterally. Clear nasal congestion, swollen turbinates. No sinus tenderness. Normal oropharynx. No obvious postnasal drip or cobblestoning.  Neck: No cervical lymphadenopathy, meningismus.  Respiratory: Normal inspiratory effort, good air movement, lungs clear bilaterally  Cardiovascular: Normal rregular rhythm, no murmurs rubs gallops GI:  Nondistended No suprapubic tenderness Back: No CVA  tenderness  skin: No rash, skin intact Musculoskeletal: no deformities Neurologic: Alert & oriented x 3, no focal neuro deficits Psychiatric: Speech and behavior appropriate   ED Course   Medications  acetaminophen (TYLENOL) tablet 1,000 mg (1,000 mg Oral Given 04/16/16 1006)    Orders Placed This Encounter  Procedures  . Rapid Influenza A&B Antigens ([facility] only)    Standing Status:   Standing    Number of Occurrences:   1  . Droplet precaution    Standing Status:   Standing    Number of Occurrences:   1    Results for orders placed or performed during the hospital encounter of 04/16/16 (from the past 24 hour(s))  Rapid Influenza A&B Antigens ([facility] only)     Status: None   Collection Time: 04/16/16  9:23 AM  Result Value Ref Range   Influenza A ([facility]) NEGATIVE NEGATIVE   Influenza B ([facility]) NEGATIVE NEGATIVE   No results found.  ED Clinical Impression  Influenza-like illness   ED Assessment/Plan  Rapid flu negative. However, Presentation most consistent with influenza-like illness/influenza.Febrile here. No evidence of otitis, meningitis, sinusitis, pneumonia or intra-abdominal process. Plan to send home with saline nasal irrigation with a neti pot, regular Mucinex, Tussionex and Tessalon Perles. He is to start Flonase, ibuprofen 800 mg 1 g of Tylenol 3 times a day. States he does not need a prescription for this. We'll send home with Tamiflu as well Follow up with PMD as needed.  Discussed labs,  MDM, plan and followup with patient.  Patient agrees with plan.   Meds ordered this encounter  Medications  . acetaminophen (TYLENOL) tablet 1,000 mg  . ibuprofen (ADVIL,MOTRIN) 800 MG tablet    Sig: Take 1 tablet (800 mg total) by mouth 3 (three) times daily.    Dispense:  30 tablet    Refill:  0  . benzonatate (TESSALON) 200 MG capsule    Sig: Take 1 capsule (200 mg total) by mouth 3 (three) times daily as needed for cough.    Dispense:  20 capsule    Refill:  0  .  chlorpheniramine-HYDROcodone (TUSSIONEX PENNKINETIC ER) 10-8 MG/5ML SUER    Sig: Take 5 mLs by mouth every 12 (twelve) hours as needed for cough.    Dispense:  120 mL    Refill:  0  . fluticasone (FLONASE) 50 MCG/ACT nasal spray    Sig: Place 2 sprays into both nostrils daily.    Dispense:  16 g    Refill:  0  . oseltamivir (TAMIFLU) 75 MG capsule    Sig: Take 1 capsule (75 mg total) by mouth 2 (two) times daily. X 5 days    Dispense:  10 capsule    Refill:  0    *This clinic note was created using [employer]. Therefore, there may be occasional mistakes despite careful proofreading.  ?   Melynda Ripple, MD 04/16/16 1444

## 2016-04-19 ENCOUNTER — Encounter: Payer: Self-pay | Admitting: *Deleted

## 2016-04-20 ENCOUNTER — Encounter: Payer: Self-pay | Admitting: General Surgery

## 2016-04-20 ENCOUNTER — Ambulatory Visit (INDEPENDENT_AMBULATORY_CARE_PROVIDER_SITE_OTHER): Payer: PRIVATE HEALTH INSURANCE | Admitting: General Surgery

## 2016-04-20 VITALS — BP 128/74 | HR 74 | Resp 12 | Ht 72.0 in | Wt 199.0 lb

## 2016-04-20 DIAGNOSIS — C439 Malignant melanoma of skin, unspecified: Secondary | ICD-10-CM

## 2016-04-20 NOTE — Progress Notes (Signed)
Patient ID: ESIAS MCTIER, male   DOB: 1955-08-12, 61 y.o.   MRN: [id number]  Chief Complaint  Patient presents with  . Follow-up    HPI Dwayne Hunt is a 61 y.o. male.  Follow up from shoulder excision. Patient states he got the flu test on Saturday and came back positive.  HPI  Past Medical History:  Diagnosis Date  . Gout   . Hypertension   . Melanoma ([place]) 2016   left shoulder    Past Surgical History:  Procedure Laterality Date  . BIOPSY SHOULDER Right 02/10/2016   invasive melanoma  . COLONOSCOPY  2012  . MELANOMA EXCISION Left 2016   Dr Aubery Lapping  . UPPER GI ENDOSCOPY  2012    Family History  Problem Relation Age of Onset  . Ovarian cancer Mother   . Leukemia Father     Social History Social History  Substance Use Topics  . Smoking status: Former Smoker    Years: 10.00    Types: Cigarettes    Quit date: 04/11/2005  . Smokeless tobacco: Never Used  . Alcohol use 0.0 oz/week     Comment: 2/day    No Known Allergies  Current Outpatient Prescriptions  Medication Sig Dispense Refill  . allopurinol (ZYLOPRIM) 300 MG tablet Take 300 mg by mouth daily.  12  . ALPRAZolam (XANAX) 1 MG tablet Take 0.5 mg by mouth at bedtime as needed for anxiety.    . benzonatate (TESSALON) 200 MG capsule Take 1 capsule (200 mg total) by mouth 3 (three) times daily as needed for cough. 20 capsule 0  . chlorpheniramine-HYDROcodone (TUSSIONEX PENNKINETIC ER) 10-8 MG/5ML SUER Take 5 mLs by mouth every 12 (twelve) hours as needed for cough. 120 mL 0  . colchicine 0.6 MG tablet Take 0.6 mg by mouth as needed.    [name] (DEXILANT) 60 MG capsule Take by mouth.    . fluticasone (FLONASE) 50 MCG/ACT nasal spray Place 2 sprays into both nostrils daily. 16 g 0  . ibuprofen (ADVIL,MOTRIN) 800 MG tablet Take 1 tablet (800 mg total) by mouth 3 (three) times daily. 30 tablet 0  . lisinopril (PRINIVIL,ZESTRIL) 20 MG tablet Take 20 mg by mouth daily.  4  . oseltamivir (TAMIFLU) 75  MG capsule Take 1 capsule (75 mg total) by mouth 2 (two) times daily. X 5 days 10 capsule 0  . tadalafil (CIALIS) 20 MG tablet     . zolpidem (AMBIEN) 10 MG tablet Take 10 mg by mouth at bedtime as needed.  2   No current facility-administered medications for this visit.     Review of Systems Review of Systems  Constitutional: Negative.   Respiratory: Negative.   Cardiovascular: Negative.     Blood pressure 128/74, pulse 74, resp. rate 12, height 6' (1.829 m), weight 199 lb (90.3 kg).  Physical Exam Physical Exam  Constitutional: He is oriented to person, place, and time. He appears well-developed and well-nourished.  Musculoskeletal:       Arms: Neurological: He is alert and oriented to person, place, and time.  Skin: Skin is warm and dry.  Psychiatric: His behavior is normal.    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    Plan    Regular dermatology follow-up will be appropriate.   Patient to  return as needed.   This information has been scribed by Gaspar Cola CMA.   [name] 04/21/2016, 8:30 PM

## 2016-04-20 NOTE — Patient Instructions (Signed)
The patient is aware to call back for any questions or concerns.  

## 2017-01-20 ENCOUNTER — Other Ambulatory Visit: Payer: Self-pay

## 2017-01-20 DIAGNOSIS — Z86010 Personal history of colonic polyps: Secondary | ICD-10-CM

## 2017-01-26 ENCOUNTER — Encounter: Payer: Self-pay | Admitting: *Deleted

## 2017-02-09 NOTE — Discharge Instructions (Signed)
General Anesthesia, Adult, Care After °These instructions provide you with information about caring for yourself after your procedure. Your health care provider may also give you more specific instructions. Your treatment has been planned according to current medical practices, but problems sometimes occur. Call your health care provider if you have any problems or questions after your procedure. °What can I expect after the procedure? °After the procedure, it is common to have: °· Vomiting. °· A sore throat. °· Mental slowness. ° °It is common to feel: °· Nauseous. °· Cold or shivery. °· Sleepy. °· Tired. °· Sore or achy, even in parts of your body where you did not have surgery. ° °Follow these instructions at home: °For at least 24 hours after the procedure: °· Do not: °? Participate in activities where you could fall or become injured. °? Drive. °? Use heavy machinery. °? Drink alcohol. °? Take sleeping pills or medicines that cause drowsiness. °? Make important decisions or sign legal documents. °? Take care of children on your own. °· Rest. °Eating and drinking °· If you vomit, drink water, juice, or soup when you can drink without vomiting. °· Drink enough fluid to keep your urine clear or pale yellow. °· Make sure you have little or no nausea before eating solid foods. °· Follow the diet recommended by your health care provider. °General instructions °· Have a responsible adult stay with you until you are awake and alert. °· Return to your normal activities as told by your health care provider. Ask your health care provider what activities are safe for you. °· Take over-the-counter and prescription medicines only as told by your health care provider. °· If you smoke, do not smoke without supervision. °· Keep all follow-up visits as told by your health care provider. This is important. °Contact a health care provider if: °· You continue to have nausea or vomiting at home, and medicines are not helpful. °· You  cannot drink fluids or start eating again. °· You cannot urinate after 8-12 hours. °· You develop a skin rash. °· You have fever. °· You have increasing redness at the site of your procedure. °Get help right away if: °· You have difficulty breathing. °· You have chest pain. °· You have unexpected bleeding. °· You feel that you are having a life-threatening or urgent problem. °This information is not intended to replace advice given to you by your health care provider. Make sure you discuss any questions you have with your health care provider. °Document Released: 07/04/2000 Document Revised: 08/31/2015 Document Reviewed: 03/12/2015 °Elsevier Interactive Patient Education © 2018 [employer] ° °

## 2017-02-13 NOTE — Progress Notes (Signed)
[name] [address] [place], [place] [address] Phone: [phone] Subjective:     CC: Neck pain  [id number]  Dwayne Hunt is a 61 y.o. male coming in with complaint of pain.  Has had idiopathic chronic gout.  Patient also has melanoma.  Patient has had this pain for about 1 year intermittently.  States that it seems to be worsening.  Stiff in the morning sometimes.  Patient has not tried any modalities.  No longer working out on a regular basis over the course last year since he lost his father.  Patient states that it also seems to increase with his work and may be get a little bit better on the weekends.  Not debilitating but is very annoying.  Rates the severity of pain is 5 out of 10     Past Medical History:  Diagnosis Date  . GERD (gastroesophageal reflux disease)   . Gout   . Hypertension   . Melanoma ([place]) 2016   left shoulder   Past Surgical History:  Procedure Laterality Date  . BIOPSY SHOULDER Right 02/10/2016   invasive melanoma  . COLONOSCOPY  2012  . MELANOMA EXCISION Left 2016   Dr Aubery Lapping  . UPPER GI ENDOSCOPY  2012   Social History   Socioeconomic History  . Marital status: Divorced    Spouse name: Not on file  . Number of children: Not on file  . Years of education: Not on file  . Highest education level: Not on file  Social Needs  . Financial resource strain: Not on file  . Food insecurity - worry: Not on file  . Food insecurity - inability: Not on file  . Transportation needs - medical: Not on file  . Transportation needs - non-medical: Not on file  Occupational History  . Not on file  Tobacco Use  . Smoking status: Former Smoker    Years: 10.00    Types: Cigarettes    Last attempt to quit: 04/11/2005    Years since quitting: 11.8  . Smokeless tobacco: Never Used  Substance and Sexual Activity  . Alcohol use: Yes    Alcohol/week: 8.4 oz    Types: 14 Shots of liquor per week    Comment: 2/day  .  Drug use: No  . Sexual activity: Not on file  Other Topics Concern  . Not on file  Social History Narrative  . Not on file   No Known Allergies Family History  Problem Relation Age of Onset  . Ovarian cancer Mother   . Leukemia Father      Past medical history, social, surgical and family history all reviewed in electronic medical record.  No pertanent information unless stated regarding to the chief complaint.   Review of Systems:Review of systems updated and as accurate as of 02/13/17  No headache, visual changes, nausea, vomiting, diarrhea, constipation, dizziness, abdominal pain, skin rash, fevers, chills, night sweats, weight loss, swollen lymph nodes, body aches, joint swelling, chest pain, shortness of breath, mood changes.  Positive muscle aches  Objective  There were no vitals taken for this visit. Systems examined below as of 02/13/17   General: No apparent distress alert and oriented x3 mood and affect normal, dressed appropriately.  HEENT: Pupils equal, extraocular movements intact  Respiratory: Patient's speak in full sentences and does not appear short of breath  Cardiovascular: No lower extremity edema, non tender, no erythema  Skin: Warm dry intact with no signs of infection  or rash on extremities or on axial skeleton.  Abdomen: Soft nontender  Neuro: Cranial nerves II through XII are intact, neurovascularly intact in all extremities with 2+ DTRs and 2+ pulses.  Lymph: No lymphadenopathy of posterior or anterior cervical chain or axillae bilaterally.  Gait normal with good balance and coordination.  MSK:  Non tender with full range of motion and good stability and symmetric strength and tone of shoulders, elbows, wrist, hip, knee and ankles bilaterally.  Neck: Inspection mild increase in kyphosis of the upper back . No palpable stepoffs. Negative Spurling's maneuver. Limitation and right-sided side bending and left-sided rotation. Grip strength and sensation  normal in bilateral hands Strength good C4 to T1 distribution No sensory change to C4 to T1 Negative Hoffman sign bilaterally Reflexes normal  Osteopathic findings C2 flexed rotated and side bent right C4 flexed rotated and side bent left T3 extended rotated and side bent right inhaled third rib T6 extended rotated and side bent left    97110; 15 additional minutes spent for Therapeutic exercises as stated in above notes.  This included exercises focusing on stretching, strengthening, with significant focus on eccentric aspects.   Long term goals include an improvement in range of motion, strength, endurance as well as avoiding reinjury. Patient's frequency would include in 1-2 times a day, 3-5 times a week for a duration of 6-12 weeks.. Exercises that included:  Basic scapular stabilization to include adduction and depression of scapula Scaption, focusing on proper movement and good control Internal and External rotation utilizing a theraband, with elbow tucked at side entire time Rows with theraband which was given    Proper technique shown and discussed handout in great detail with ATC.  All questions were discussed and answered.   Impression and Recommendations:     This case required medical decision making of moderate complexity.      Note: This dictation was prepared with Dragon dictation along with smaller phrase technology. Any transcriptional errors that result from this process are unintentional.

## 2017-02-14 ENCOUNTER — Ambulatory Visit (INDEPENDENT_AMBULATORY_CARE_PROVIDER_SITE_OTHER)
Admission: RE | Admit: 2017-02-14 | Discharge: 2017-02-14 | Disposition: A | Discharge status: Home / Self Care | Payer: PRIVATE HEALTH INSURANCE | Source: Ambulatory Visit | Attending: Family Medicine | Admitting: Family Medicine

## 2017-02-14 ENCOUNTER — Ambulatory Visit: Payer: PRIVATE HEALTH INSURANCE | Admitting: Family Medicine

## 2017-02-14 ENCOUNTER — Encounter: Payer: Self-pay | Admitting: Family Medicine

## 2017-02-14 DIAGNOSIS — M542 Cervicalgia: Secondary | ICD-10-CM

## 2017-02-14 DIAGNOSIS — M999 Biomechanical lesion, unspecified: Secondary | ICD-10-CM | POA: Insufficient documentation

## 2017-02-14 MED ORDER — DICLOFENAC SODIUM 2 % TD SOLN: 2.0000 "application " | Freq: Two times a day (BID) | TRANSDERMAL | 3 refills | Status: DC

## 2017-02-14 MED ORDER — PREDNISONE 50 MG PO TABS: 50.0000 mg | ORAL_TABLET | Freq: Every day | ORAL | 0 refills | Status: DC

## 2017-02-14 MED ORDER — TIZANIDINE HCL 4 MG PO TABS: 4.0000 mg | ORAL_TABLET | Freq: Every evening | ORAL | 2 refills | Status: AC

## 2017-02-14 NOTE — Assessment & Plan Note (Signed)
Believe the patient's neck pain is likely multifactorial.  Patient likely has some osteoarthritic changes but also possibly has some imbalances of the muscle and potentially even stress plays a role.  Patient given topical anti-inflammatories, muscle relaxer at night, worsening symptoms after patient's colonoscopy can take prednisone.  Attempted osteopathic manipulation with fairly good resolution of pain.  Hopefully that this will be beneficial.  Patient will see me again in 2-3 weeks for further evaluation.  Any radicular symptoms or weakness of the upper extremity advanced imaging could be warranted.  May also consider formal physical therapy.

## 2017-02-14 NOTE — Assessment & Plan Note (Addendum)
Decision today to treat with OMT was based on Physical Exam  After verbal consent patient was treated with HVLA, ME, FPR techniques in cervical, thoracic, rib,  areas  Patient tolerated the procedure well with improvement in symptoms  Patient given exercises, stretches and lifestyle modifications  See medications in patient instructions if given  Patient will follow up in 4 weeks 

## 2017-02-14 NOTE — Patient Instructions (Signed)
Good to see you.  Ice 20 minutes 2 times daily. Usually after activity and before bed. pennsaid pinkie amount topically 2 times daily as needed.   Muscle relaxer at night After the procedure if still in pain prednisone daily for 5 days Xray downstairs See me again in 2-3 weeks

## 2017-02-16 ENCOUNTER — Ambulatory Visit: Payer: 59 | Admitting: Anesthesiology

## 2017-02-16 ENCOUNTER — Ambulatory Visit
Admission: RE | Admit: 2017-02-16 | Discharge: 2017-02-16 | Disposition: A | Discharge status: Home / Self Care | Payer: 59 | Source: Ambulatory Visit | Attending: Gastroenterology | Admitting: Gastroenterology

## 2017-02-16 ENCOUNTER — Encounter
Admission: RE | Disposition: A | Discharge status: Home / Self Care | Payer: Self-pay | Source: Ambulatory Visit | Attending: Gastroenterology

## 2017-02-16 DIAGNOSIS — K219 Gastro-esophageal reflux disease without esophagitis: Secondary | ICD-10-CM | POA: Diagnosis not present

## 2017-02-16 DIAGNOSIS — D123 Benign neoplasm of transverse colon: Secondary | ICD-10-CM

## 2017-02-16 DIAGNOSIS — Z860100 Personal history of colon polyps, unspecified: Secondary | ICD-10-CM

## 2017-02-16 DIAGNOSIS — K317 Polyp of stomach and duodenum: Secondary | ICD-10-CM

## 2017-02-16 DIAGNOSIS — K621 Rectal polyp: Secondary | ICD-10-CM

## 2017-02-16 DIAGNOSIS — Z85820 Personal history of malignant melanoma of skin: Secondary | ICD-10-CM | POA: Insufficient documentation

## 2017-02-16 DIAGNOSIS — D122 Benign neoplasm of ascending colon: Secondary | ICD-10-CM

## 2017-02-16 DIAGNOSIS — M109 Gout, unspecified: Secondary | ICD-10-CM | POA: Diagnosis not present

## 2017-02-16 DIAGNOSIS — I10 Essential (primary) hypertension: Secondary | ICD-10-CM | POA: Diagnosis not present

## 2017-02-16 DIAGNOSIS — Z87891 Personal history of nicotine dependence: Secondary | ICD-10-CM | POA: Insufficient documentation

## 2017-02-16 DIAGNOSIS — R12 Heartburn: Secondary | ICD-10-CM | POA: Insufficient documentation

## 2017-02-16 DIAGNOSIS — Z1211 Encounter for screening for malignant neoplasm of colon: Secondary | ICD-10-CM | POA: Diagnosis present

## 2017-02-16 DIAGNOSIS — Z79899 Other long term (current) drug therapy: Secondary | ICD-10-CM | POA: Diagnosis not present

## 2017-02-16 DIAGNOSIS — K640 First degree hemorrhoids: Secondary | ICD-10-CM | POA: Diagnosis not present

## 2017-02-16 DIAGNOSIS — K449 Diaphragmatic hernia without obstruction or gangrene: Secondary | ICD-10-CM | POA: Insufficient documentation

## 2017-02-16 HISTORY — PX: COLONOSCOPY WITH PROPOFOL: SHX5780

## 2017-02-16 HISTORY — PX: POLYPECTOMY: SHX5525

## 2017-02-16 HISTORY — DX: Gastro-esophageal reflux disease without esophagitis: K21.9

## 2017-02-16 HISTORY — PX: ESOPHAGOGASTRODUODENOSCOPY (EGD) WITH PROPOFOL: SHX5813

## 2017-02-16 SURGERY — COLONOSCOPY WITH PROPOFOL
Anesthesia: General | Wound class: Contaminated

## 2017-02-16 MED ORDER — LIDOCAINE HCL (CARDIAC) 20 MG/ML IV SOLN
INTRAVENOUS | Status: DC | PRN
  Administered 2017-02-16: 50 mg via INTRAVENOUS

## 2017-02-16 MED ORDER — PROPOFOL 10 MG/ML IV BOLUS
INTRAVENOUS | Status: DC | PRN
  Administered 2017-02-16 (×3): 20 mg via INTRAVENOUS
  Administered 2017-02-16: 10 mg via INTRAVENOUS
  Administered 2017-02-16 (×4): 20 mg via INTRAVENOUS
  Administered 2017-02-16: 80 mg via INTRAVENOUS
  Administered 2017-02-16: 20 mg via INTRAVENOUS
  Administered 2017-02-16 (×4): 30 mg via INTRAVENOUS
  Administered 2017-02-16: 10 mg via INTRAVENOUS

## 2017-02-16 MED ORDER — STERILE WATER FOR IRRIGATION IR SOLN
Status: DC | PRN
  Administered 2017-02-16: 09:00:00

## 2017-02-16 MED ORDER — SODIUM CHLORIDE 0.9 % IV SOLN: INTRAVENOUS | Status: DC

## 2017-02-16 MED ORDER — LACTATED RINGERS IV SOLN
INTRAVENOUS | Status: DC
  Administered 2017-02-16 (×2): via INTRAVENOUS

## 2017-02-16 MED ORDER — GLYCOPYRROLATE 0.2 MG/ML IJ SOLN
INTRAMUSCULAR | Status: DC | PRN
  Administered 2017-02-16: 0.2 mg via INTRAVENOUS

## 2017-02-16 SURGICAL SUPPLY — 35 items
BALLN DILATOR 10-12 8 (BALLOONS)
BALLN DILATOR 12-15 8 (BALLOONS)
BALLN DILATOR 15-18 8 (BALLOONS)
BALLN DILATOR CRE 0-12 8 (BALLOONS)
BALLN DILATOR ESOPH 8 10 CRE (MISCELLANEOUS) IMPLANT
BALLOON DILATOR 12-15 8 (BALLOONS) IMPLANT
BALLOON DILATOR 15-18 8 (BALLOONS) IMPLANT
BALLOON DILATOR CRE 0-12 8 (BALLOONS) IMPLANT
BLOCK BITE 60FR ADLT L/F GRN (MISCELLANEOUS) ×3 IMPLANT
CANISTER SUCT 1200ML W/VALVE (MISCELLANEOUS) ×3 IMPLANT
CLIP HMST 235XBRD CATH ROT (MISCELLANEOUS) IMPLANT
CLIP RESOLUTION 360 11X235 (MISCELLANEOUS)
FCP ESCP3.2XJMB 240X2.8X (MISCELLANEOUS)
FORCEPS BIOP RAD 4 LRG CAP 4 (CUTTING FORCEPS) ×3 IMPLANT
FORCEPS BIOP RJ4 240 W/NDL (MISCELLANEOUS)
FORCEPS ESCP3.2XJMB 240X2.8X (MISCELLANEOUS) IMPLANT
GOWN CVR UNV OPN BCK APRN NK (MISCELLANEOUS) ×4 IMPLANT
GOWN ISOL THUMB LOOP REG UNIV (MISCELLANEOUS) ×2
INJECTOR VARIJECT VIN23 (MISCELLANEOUS) IMPLANT
KIT DEFENDO VALVE AND CONN (KITS) IMPLANT
KIT ENDO PROCEDURE OLY (KITS) ×3 IMPLANT
MARKER SPOT ENDO TATTOO 5ML (MISCELLANEOUS) IMPLANT
PAD GROUND ADULT SPLIT (MISCELLANEOUS) IMPLANT
PROBE APC STR FIRE (PROBE) IMPLANT
RETRIEVER NET PLAT FOOD (MISCELLANEOUS) IMPLANT
RETRIEVER NET ROTH 2.5X230 LF (MISCELLANEOUS) IMPLANT
SNARE SHORT THROW 13M SML OVAL (MISCELLANEOUS) ×3 IMPLANT
SNARE SHORT THROW 30M LRG OVAL (MISCELLANEOUS) IMPLANT
SNARE SNG USE RND 15MM (INSTRUMENTS) IMPLANT
SPOT EX ENDOSCOPIC TATTOO (MISCELLANEOUS)
SYR INFLATION 60ML (SYRINGE) IMPLANT
TRAP ETRAP POLY (MISCELLANEOUS) ×6 IMPLANT
VARIJECT INJECTOR VIN23 (MISCELLANEOUS)
WATER STERILE IRR 250ML POUR (IV SOLUTION) ×3 IMPLANT
WIRE CRE 18-20MM 8CM F G (MISCELLANEOUS) IMPLANT

## 2017-02-16 NOTE — Anesthesia Preprocedure Evaluation (Signed)
Anesthesia Evaluation  Patient identified by MRN, date of birth, ID band Patient awake    Reviewed: Allergy & Precautions, H&P , NPO status , Patient's Chart, lab work & pertinent test results, reviewed documented beta blocker date and time   Airway Mallampati: II  TM Distance: >3 FB Neck ROM: full    Dental no notable dental hx.    Pulmonary former smoker,    Pulmonary exam normal breath sounds clear to auscultation       Cardiovascular hypertension,  Rhythm:regular Rate:Normal     Neuro/Psych negative neurological ROS  negative psych ROS   GI/Hepatic Neg liver ROS, GERD  Controlled,  Endo/Other  negative endocrine ROS  Renal/GU negative Renal ROS  negative genitourinary   Musculoskeletal   Abdominal   Peds  Hematology negative hematology ROS (+)   Anesthesia Other Findings   Reproductive/Obstetrics negative OB ROS                             Anesthesia Physical Anesthesia Plan  ASA: II  Anesthesia Plan: General   Post-op Pain Management:    Induction:   PONV Risk Score and Plan: 1 and Ondansetron  Airway Management Planned:   Additional Equipment:   Intra-op Plan:   Post-operative Plan:   Informed Consent: I have reviewed the patients History and Physical, chart, labs and discussed the procedure including the risks, benefits and alternatives for the proposed anesthesia with the patient or authorized representative who has indicated his/her understanding and acceptance.     Plan Discussed with: CRNA  Anesthesia Plan Comments:         Anesthesia Quick Evaluation

## 2017-02-16 NOTE — H&P (Signed)
Lucilla Lame, MD [facility] [address] [place], [place] [address] [phone] Fax : [phone]  Primary Care Physician:  [name], MD Primary Gastroenterologist:  Dr. Allen Norris  Pre-Procedure History & Physical: HPI:  Dwayne Hunt is a 61 y.o. male is here for an endoscopy and colonoscopy.   Past Medical History:  Diagnosis Date  . GERD (gastroesophageal reflux disease)   . Gout   . Hypertension   . Melanoma ([place]) 2016   left shoulder    Past Surgical History:  Procedure Laterality Date  . BIOPSY SHOULDER Right 02/10/2016   invasive melanoma  . COLONOSCOPY  2012  . MELANOMA EXCISION Left 2016   Dr Aubery Lapping  . UPPER GI ENDOSCOPY  2012    Prior to Admission medications   Medication Sig Start Date End Date Taking? Authorizing Provider  allopurinol (ZYLOPRIM) 300 MG tablet Take 300 mg by mouth daily. 07/13/15  Yes [provider]  ALPRAZolam Duanne Moron) 1 MG tablet Take 0.5 mg by mouth at bedtime as needed for anxiety.   Yes [provider]  colchicine 0.6 MG tablet Take 0.6 mg by mouth as needed.   Yes [provider]  dexlansoprazole (DEXILANT) 60 MG capsule Take by mouth.   Yes [provider]  lisinopril (PRINIVIL,ZESTRIL) 20 MG tablet Take 20 mg by mouth daily. 07/13/15  Yes [provider]  Multiple Vitamins-Minerals (EMERGEN-C IMMUNE PO) Take by mouth daily.   Yes [provider]  tadalafil (CIALIS) 20 MG tablet  03/20/15  Yes [provider]  tiZANidine (ZANAFLEX) 4 MG tablet Take 1 tablet (4 mg total) Nightly for 10 days by mouth. 02/14/17 02/24/17 Yes Lyndal Pulley, DO  zolpidem (AMBIEN) 10 MG tablet Take 10 mg by mouth at bedtime as needed. 07/13/15  Yes [provider]  Diclofenac Sodium (PENNSAID) 2 % SOLN Place 2 application 2 (two) times daily onto the skin. 02/14/17   Lyndal Pulley, DO  ibuprofen (ADVIL,MOTRIN) 800 MG tablet Take 1 tablet (800 mg total) by mouth 3 (three)  times daily. Patient not taking: Reported on 01/26/2017 04/16/16   Melynda Ripple, MD  predniSONE (DELTASONE) 50 MG tablet Take 1 tablet (50 mg total) daily by mouth. Patient not taking: Reported on 02/16/2017 02/14/17   Lyndal Pulley, DO    Allergies as of 01/20/2017  . (No Known Allergies)    Family History  Problem Relation Age of Onset  . Ovarian cancer Mother   . Leukemia Father     Social History   Socioeconomic History  . Marital status: Divorced    Spouse name: Not on file  . Number of children: Not on file  . Years of education: Not on file  . Highest education level: Not on file  Social Needs  . Financial resource strain: Not on file  . Food insecurity - worry: Not on file  . Food insecurity - inability: Not on file  . Transportation needs - medical: Not on file  . Transportation needs - non-medical: Not on file  Occupational History  . Not on file  Tobacco Use  . Smoking status: Former Smoker    Years: 10.00    Types: Cigarettes    Last attempt to quit: 04/11/2005    Years since quitting: 11.8  . Smokeless tobacco: Never Used  Substance and Sexual Activity  . Alcohol use: Yes    Alcohol/week: 8.4 oz    Types: 14 Shots of liquor per week    Comment:  2/day  . Drug use: No  . Sexual activity: Not on file  Other Topics Concern  . Not on file  Social History Narrative  . Not on file    Review of Systems: See HPI, otherwise negative ROS  Physical Exam: BP (!) 144/91   Pulse 65   Temp 98.8 F (37.1 C) (Temporal)   Resp 16   Ht 6' (1.829 m)   Wt 191 lb (86.6 kg)   SpO2 97%   BMI 25.90 kg/m  General:   Alert,  pleasant and cooperative in NAD Head:  Normocephalic and atraumatic. Neck:  Supple; no masses or thyromegaly. Lungs:  Clear throughout to auscultation.    Heart:  Regular rate and rhythm. Abdomen:  Soft, nontender and nondistended. Normal bowel sounds, without guarding, and without rebound.   Neurologic:  Alert and  oriented x4;  grossly  normal neurologically.  Impression/Plan: Dwayne Hunt is here for an endoscopy and colonoscopy to be performed for GERD and history of colonpoylps  Risks, benefits, limitations, and alternatives regarding  endoscopy and colonoscopy have been reviewed with the patient.  Questions have been answered.  All parties agreeable.   Lucilla Lame, MD  02/16/2017, 8:11 AM

## 2017-02-16 NOTE — Transfer of Care (Signed)
Immediate Anesthesia Transfer of Care Note  Patient: Dwayne Hunt  Procedure(s) Performed: COLONOSCOPY WITH PROPOFOL (N/A ) ESOPHAGOGASTRODUODENOSCOPY (EGD) WITH PROPOFOL (N/A ) POLYPECTOMY  Patient Location: PACU  Anesthesia Type: General  Level of Consciousness: awake, alert  and patient cooperative  Airway and Oxygen Therapy: Patient Spontanous Breathing and Patient connected to supplemental oxygen  Post-op Assessment: Post-op Vital signs reviewed, Patient's Cardiovascular Status Stable, Respiratory Function Stable, Patent Airway and No signs of Nausea or vomiting  Post-op Vital Signs: Reviewed and stable  Complications: No apparent anesthesia complications

## 2017-02-16 NOTE — Anesthesia Postprocedure Evaluation (Signed)
Anesthesia Post Note  Patient: Dwayne Hunt  Procedure(s) Performed: COLONOSCOPY WITH PROPOFOL (N/A ) ESOPHAGOGASTRODUODENOSCOPY (EGD) WITH PROPOFOL (N/A ) POLYPECTOMY  Patient location during evaluation: PACU Anesthesia Type: General Level of consciousness: awake and alert Pain management: pain level controlled Vital Signs Assessment: post-procedure vital signs reviewed and stable Respiratory status: spontaneous breathing, nonlabored ventilation, respiratory function stable and patient connected to nasal cannula oxygen Cardiovascular status: blood pressure returned to baseline and stable Postop Assessment: no apparent nausea or vomiting Anesthetic complications: no    Bertice Risse D Jerae Izard

## 2017-02-16 NOTE — Op Note (Signed)
[facility] Gastroenterology Patient Name: Dwayne Hunt Procedure Date: 02/16/2017 9:19 AM MRN: [id number] Account #: [id number] Date of Birth: 03/17/1956 Admit Type: Outpatient Age: 61 Room: [facility] OR ROOM 01 Gender: Male Note Status: Finalized Procedure:            Colonoscopy Indications:          High risk colon cancer surveillance: Personal history                        of colonic polyps Providers:            Lucilla Lame MD, MD Referring MD:         Leona Carry. Hall Busing, MD (Referring MD) Medicines:            Propofol per Anesthesia Complications:        No immediate complications. Procedure:            Pre-Anesthesia Assessment:                       - Prior to the procedure, a History and Physical was                        performed, and patient medications and allergies were                        reviewed. The patient's tolerance of previous                        anesthesia was also reviewed. The risks and benefits of                        the procedure and the sedation options and risks were                        discussed with the patient. All questions were                        answered, and informed consent was obtained. Prior                        Anticoagulants: The patient has taken no previous                        anticoagulant or antiplatelet agents. ASA Grade                        Assessment: II - A patient with mild systemic disease.                        After reviewing the risks and benefits, the patient was                        deemed in satisfactory condition to undergo the                        procedure.                       After obtaining informed consent, the colonoscope was  passed under direct vision. Throughout the procedure,                        the patient's blood pressure, pulse, and oxygen                        saturations were monitored continuously. The Olympus                        Colonoscope  [id number]) was introduced through the                        anus and advanced to the the cecum, identified by                        appendiceal orifice and ileocecal valve. The                        colonoscopy was performed without difficulty. The                        patient tolerated the procedure well. The quality of                        the bowel preparation was excellent. Findings:      The perianal and digital rectal examinations were normal.      A 3 mm polyp was found in the transverse colon. The polyp was sessile.       The polyp was removed with a cold snare. Resection and retrieval were       complete.      Two sessile polyps were found in the ascending colon. The polyps were 4       to 5 mm in size. These polyps were removed with a cold snare. Resection       and retrieval were complete.      Two sessile polyps were found in the rectum. The polyps were 2 to 3 mm       in size. These polyps were removed with a cold biopsy forceps. Resection       and retrieval were complete.      Non-bleeding internal hemorrhoids were found during retroflexion. The       hemorrhoids were Grade I (internal hemorrhoids that do not prolapse). Impression:           - One 3 mm polyp in the transverse colon, removed with                        a cold snare. Resected and retrieved.                       - Two 4 to 5 mm polyps in the ascending colon, removed                        with a cold snare. Resected and retrieved.                       - Two 2 to 3 mm polyps in the rectum, removed with a  cold biopsy forceps. Resected and retrieved.                       - Non-bleeding internal hemorrhoids. Recommendation:       - Discharge patient to home.                       - Resume previous diet.                       - Continue present medications.                       - Await pathology results.                       - Repeat colonoscopy in 5 years for  surveillance. Procedure Code(s):    --- Professional ---                       [phone], Colonoscopy, flexible; with removal of tumor(s),                        polyp(s), or other lesion(s) by snare technique                       45380, 32, Colonoscopy, flexible; with biopsy, single                        or multiple Diagnosis Code(s):    --- Professional ---                       Z86.010, Personal history of colonic polyps                       D12.3, Benign neoplasm of transverse colon (hepatic                        flexure or splenic flexure)                       D12.2, Benign neoplasm of ascending colon                       K62.1, Rectal polyp CPT copyright 2016 American Medical Association. All rights reserved. The codes documented in this report are preliminary and upon coder review may  be revised to meet current compliance requirements. Lucilla Lame MD, MD 02/16/2017 9:50:32 AM This report has been signed electronically. Number of Addenda: 0 Note Initiated On: 02/16/2017 9:19 AM Scope Withdrawal Time: 0 hours 7 minutes 53 seconds  Total Procedure Duration: 0 hours 11 minutes 37 seconds       [facility]

## 2017-02-16 NOTE — Op Note (Signed)
[facility] Gastroenterology Patient Name: Dwayne Hunt Procedure Date: 02/16/2017 9:12 AM MRN: [id number] Account #: [id number] Date of Birth: 1955/08/22 Admit Type: Outpatient Age: 61 Room: [facility] OR ROOM 01 Gender: Male Note Status: Finalized Procedure:            Upper GI endoscopy Indications:          Heartburn Providers:            Lucilla Lame MD, MD Referring MD:         Leona Carry. Hall Busing, MD (Referring MD) Medicines:            Propofol per Anesthesia Complications:        No immediate complications. Procedure:            Pre-Anesthesia Assessment:                       - Prior to the procedure, a History and Physical was                        performed, and patient medications and allergies were                        reviewed. The patient's tolerance of previous                        anesthesia was also reviewed. The risks and benefits of                        the procedure and the sedation options and risks were                        discussed with the patient. All questions were                        answered, and informed consent was obtained. Prior                        Anticoagulants: The patient has taken no previous                        anticoagulant or antiplatelet agents. ASA Grade                        Assessment: II - A patient with mild systemic disease.                        After reviewing the risks and benefits, the patient was                        deemed in satisfactory condition to undergo the                        procedure.                       After obtaining informed consent, the endoscope was                        passed under direct vision. Throughout the procedure,  the patient's blood pressure, pulse, and oxygen                        saturations were monitored continuously. The Olympus                        [id number]-HQ190 Endoscope (S#. [id number]) was introduced                        through the mouth,  and advanced to the second part of                        duodenum. The upper GI endoscopy was accomplished                        without difficulty. The patient tolerated the procedure                        well. Findings:      A small hiatal hernia was present.      Multiple pedunculated and sessile polyps with no bleeding and no       stigmata of recent bleeding were found in the gastric fundus. Biopsies       were taken with a cold forceps for histology.      The examined duodenum was normal. Impression:           - Small hiatal hernia.                       - Multiple gastric polyps. Biopsied.                       - Normal examined duodenum. Recommendation:       - Discharge patient to home.                       - Resume previous diet.                       - Continue present medications. Procedure Code(s):    --- Professional ---                       [phone], Esophagogastroduodenoscopy, flexible, transoral;                        with biopsy, single or multiple Diagnosis Code(s):    --- Professional ---                       R12, Heartburn                       K31.7, Polyp of stomach and duodenum CPT copyright 2016 American Medical Association. All rights reserved. The codes documented in this report are preliminary and upon coder review may  be revised to meet current compliance requirements. Lucilla Lame MD, MD 02/16/2017 9:30:38 AM This report has been signed electronically. Number of Addenda: 0 Note Initiated On: 02/16/2017 9:12 AM      [facility]

## 2017-02-16 NOTE — Anesthesia Procedure Notes (Signed)
Performed by: Aayana Reinertsen, CRNA Pre-anesthesia Checklist: Patient identified, Emergency Drugs available, Suction available, Timeout performed and Patient being monitored Patient Re-evaluated:Patient Re-evaluated prior to induction Oxygen Delivery Method: Nasal cannula Placement Confirmation: positive ETCO2       

## 2017-02-17 ENCOUNTER — Encounter: Payer: Self-pay | Admitting: Gastroenterology

## 2017-02-21 ENCOUNTER — Encounter: Payer: Self-pay | Admitting: Gastroenterology

## 2017-03-15 ENCOUNTER — Ambulatory Visit: Payer: PRIVATE HEALTH INSURANCE | Admitting: Family Medicine

## 2017-03-30 ENCOUNTER — Ambulatory Visit: Payer: PRIVATE HEALTH INSURANCE | Admitting: Family Medicine

## 2017-11-23 NOTE — Progress Notes (Signed)
[facility] [address] [place], [place] [address] Phone: [phone] Subjective:      CC: right arm pain   DXA:JOINOMVEHM  Dwayne Hunt is a 62 y.o. male coming in with complaint of right lateral epicondyle pain. He hit the ground instead of the ball 2 weeks ago. Noticed pain immediately but was able to finish the round. Has tried to play a second time and hit the ground again and felt a sharp pain. Intermittent pain. Has used ice and a cho-pat strap.      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 GERD (gastroesophageal reflux disease)   . Gout   . Hypertension   . Melanoma ([place]) 2016   left shoulder   Past Surgical History:  Procedure Laterality Date  . BIOPSY SHOULDER Right 02/10/2016   invasive melanoma  . COLONOSCOPY  2012  . COLONOSCOPY WITH PROPOFOL N/A 02/16/2017   Procedure: COLONOSCOPY WITH PROPOFOL;  Surgeon: Lucilla Lame, MD;  Location: [place];  Service: Endoscopy;  Laterality: N/A;  . ESOPHAGOGASTRODUODENOSCOPY (EGD) WITH PROPOFOL N/A 02/16/2017   Procedure: ESOPHAGOGASTRODUODENOSCOPY (EGD) WITH PROPOFOL;  Surgeon: Lucilla Lame, MD;  Location: Home;  Service: Endoscopy;  Laterality: N/A;  . MELANOMA EXCISION Left 2016   Dr Aubery Lapping  . POLYPECTOMY  02/16/2017   Procedure: POLYPECTOMY;  Surgeon: Lucilla Lame, MD;  Location: [place];  Service: Endoscopy;;  . UPPER GI ENDOSCOPY  2012   Social History   Socioeconomic History  . Marital status: Divorced    Spouse name: Not on file  . Number of children: Not on file  . Years of education: Not on file  . Highest education level: Not on file  Occupational History  . Not on file  Social Needs  . Financial resource strain: Not on file  . Food insecurity:    Worry: Not on file    Inability: Not on file  . Transportation needs:    Medical: Not on file    Non-medical: Not on file  Tobacco Use  .  Smoking status: Former Smoker    Years: 10.00    Types: Cigarettes    Last attempt to quit: 04/11/2005    Years since quitting: 12.6  . Smokeless tobacco: Never Used  Substance and Sexual Activity  . Alcohol use: Yes    Alcohol/week: 14.0 standard drinks    Types: 14 Shots of liquor per week    Comment: 2/day  . Drug use: No  . Sexual activity: Not on file  Lifestyle  . Physical activity:    Days per week: Not on file    Minutes per session: Not on file  . Stress: Not on file  Relationships  . Social connections:    Talks on phone: Not on file    Gets together: Not on file    Attends religious service: Not on file    Active member of club or organization: Not on file    Attends meetings of clubs or organizations: Not on file    Relationship status: Not on file  Other Topics Concern  . Not on file  Social History Narrative  . Not on file   No Known Allergies Family History  Problem Relation Age of Onset  . Ovarian cancer Mother   . Leukemia Father      Past medical history, social, surgical and family history all reviewed in electronic medical record.  No pertanent information unless stated regarding to the chief complaint.   Review of Systems:Review of systems updated and as accurate as of 11/24/17  No headache, visual changes, nausea, vomiting, diarrhea, constipation, dizziness, abdominal pain, skin rash, fevers, chills, night sweats, weight loss, swollen lymph nodes, body aches, joint swelling, muscle aches, chest pain, shortness of breath, mood changes.   Objective  Blood pressure 110/86, height 6' (1.829 m), weight 199 lb (90.3 kg). Systems examined below as of 11/24/17   General: No apparent distress alert and oriented x3 mood and affect normal, dressed appropriately.  HEENT: Pupils equal, extraocular movements intact  Respiratory: Patient's speak in full sentences and does not appear short of breath  Cardiovascular: No lower extremity edema, non tender, no  erythema  Skin: Warm dry intact with no signs of infection or rash on extremities or on axial skeleton.  Abdomen: Soft nontender  Neuro: Cranial nerves II through XII are intact, neurovascularly intact in all extremities with 2+ DTRs and 2+ pulses.  Lymph: No lymphadenopathy of posterior or anterior cervical chain or axillae bilaterally.  Gait normal with good balance and coordination.  MSK:  Non tender with full range of motion and good stability and symmetric strength and tone of   wrist, hip, knee and ankles bilaterally.  Elbow: Right elbow Unremarkable to inspection. Range of motion full pronation, supination, flexion, extension. Strength is full to all of the above directions Stable to varus, valgus stress. Negative moving valgus stress test. Tender to palpation more over the forearms and truly over the lateral epicondylar region Ulnar nerve does not sublux. Negative cubital tunnel Tinel's. Contralateral elbow unremarkable  Musculoskeletal ultrasound was performed and interpreted by Charlann Boxer D.O.   Elbow:  Lateral epicondyle and common extensor tendon origin visualized.  No edema, effusions, or avulsions seen.  Radial head unremarkable and located in annular ligament Medial epicondyle and common flexor tendon origin visualized.  No edema, effusions, or avulsions seen. Ulnar nerve in cubital tunnel unremarkable. Patient radial nerve on the dorsal aspect of the forearm appears hypoechoic changes and does have some impingement in area where patient had removal of subcutaneous tissue Olecranon and triceps insertion visualized and unremarkable without edema, effusion, or avulsion.  No signs olecranon bursitis. Power doppler signal normal.  IMPRESSION: Right radial nerve irritation    Impression and Recommendations:     This case required medical decision making of moderate complexity.      Note: This dictation was prepared with Dragon dictation along with smaller phrase  technology. Any transcriptional errors that result from this process are unintentional.

## 2017-11-24 ENCOUNTER — Ambulatory Visit: Payer: Self-pay

## 2017-11-24 ENCOUNTER — Encounter: Payer: Self-pay | Admitting: Family Medicine

## 2017-11-24 ENCOUNTER — Ambulatory Visit: Payer: PRIVATE HEALTH INSURANCE | Admitting: Family Medicine

## 2017-11-24 VITALS — BP 110/86 | Ht 72.0 in | Wt 199.0 lb

## 2017-11-24 DIAGNOSIS — S5421XA Injury of radial nerve at forearm level, right arm, initial encounter: Secondary | ICD-10-CM | POA: Diagnosis not present

## 2017-11-24 DIAGNOSIS — S5420XA Injury of radial nerve at forearm level, unspecified arm, initial encounter: Secondary | ICD-10-CM | POA: Insufficient documentation

## 2017-11-24 DIAGNOSIS — M25521 Pain in right elbow: Secondary | ICD-10-CM

## 2017-11-24 MED ORDER — GABAPENTIN 100 MG PO CAPS
200.0000 mg | ORAL_CAPSULE | Freq: Every day | ORAL | 3 refills | Status: DC
Start: 2017-11-24 — End: 2017-12-17

## 2017-11-24 MED ORDER — DOXYCYCLINE HYCLATE 100 MG PO TABS: 100.0000 mg | ORAL_TABLET | Freq: Two times a day (BID) | ORAL | 0 refills | Status: AC

## 2017-11-24 NOTE — Patient Instructions (Addendum)
Good to see you  Ice 20 minutes 2 times daily. Usually after activity and before bed. Exercises 3 times a week.  Gabapentin 200mg  at night Doxycycline 2 times a day for 1 week- sorry for the stomahc issues No lifting overhand until I see you  See me again in 4 weeks

## 2017-11-24 NOTE — Assessment & Plan Note (Signed)
I believe more of a radial nerve irritation.  Possible from patient's scar tissue formation in the subcutaneous aspect causing impingement.  No lateral epicondylar changes noted on ultrasound today.  Patient is minimally tender over the lateral epicondylitis.  will return in 4 weeks

## 2017-12-17 ENCOUNTER — Other Ambulatory Visit: Payer: Self-pay | Admitting: Family Medicine

## 2017-12-18 NOTE — Telephone Encounter (Signed)
Refill done.  

## 2017-12-21 ENCOUNTER — Ambulatory Visit: Payer: PRIVATE HEALTH INSURANCE | Admitting: Family Medicine

## 2018-02-08 ENCOUNTER — Other Ambulatory Visit: Payer: Self-pay

## 2018-02-08 ENCOUNTER — Ambulatory Visit
Admission: EM | Admit: 2018-02-08 | Discharge: 2018-02-08 | Disposition: A | Discharge status: Home / Self Care | Payer: PRIVATE HEALTH INSURANCE | Attending: Family Medicine | Admitting: Family Medicine

## 2018-02-08 ENCOUNTER — Encounter: Payer: Self-pay | Admitting: Emergency Medicine

## 2018-02-08 DIAGNOSIS — J069 Acute upper respiratory infection, unspecified: Secondary | ICD-10-CM

## 2018-02-08 MED ORDER — AZITHROMYCIN 250 MG PO TABS: 250.0000 mg | ORAL_TABLET | Freq: Every day | ORAL | 0 refills | Status: DC

## 2018-02-08 MED ORDER — BENZONATATE 200 MG PO CAPS: ORAL_CAPSULE | ORAL | 0 refills | Status: DC

## 2018-02-08 NOTE — ED Provider Notes (Signed)
[facility]    CSN: [id number] Arrival date & time: 02/08/18  1107     History   Chief Complaint Chief Complaint  Patient presents with  . Cough  . Nasal Congestion    HPI Dwayne Hunt is a 62 y.o. male.   HPI  -year-old male presents with a 2-week history of cough congestion and ear pain.  He had fever in the first few days but has not had any recently.  Taking Aleve cold and sinus and NyQuil.  He has been producing a green mucus. He is afebrile today he has O2 sats are 90% on room air.  He quit smoking in 2007         Past Medical History:  Diagnosis Date  . GERD (gastroesophageal reflux disease)   . Gout   . Hypertension   . Melanoma ([place]) 2016   left shoulder    Patient Active Problem List   Diagnosis Date Noted  . 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 Gastroesophageal reflux disease   . Gastric polyp   . Personal history of colonic polyps   . Benign neoplasm of transverse colon   . Benign neoplasm of ascending colon   . Rectal polyp   . Neck pain 02/14/2017  . Nonallopathic lesion of cervical region 02/14/2017  . Nonallopathic lesion of thoracic region 02/14/2017  . Nonallopathic lesion of rib cage 02/14/2017  . Melanoma of skin ([place]) 02/24/2016  . BPH with obstruction/lower urinary tract symptoms 09/17/2015  . Combined arterial insufficiency and corporo-venous occlusive erectile dysfunction 09/17/2015  . Decreased testosterone level 09/17/2015  . Gout of ankle 07/22/2015    Past Surgical History:  Procedure Laterality Date  . BIOPSY SHOULDER Right 02/10/2016   invasive melanoma  . COLONOSCOPY  2012  . COLONOSCOPY WITH PROPOFOL N/A 02/16/2017   Procedure: COLONOSCOPY WITH PROPOFOL;  Surgeon: Lucilla Lame, MD;  Location: [place];  Service: Endoscopy;  Laterality: N/A;  . ESOPHAGOGASTRODUODENOSCOPY (EGD) WITH PROPOFOL N/A 02/16/2017   Procedure: ESOPHAGOGASTRODUODENOSCOPY (EGD) WITH PROPOFOL;  Surgeon:  Lucilla Lame, MD;  Location: [place];  Service: Endoscopy;  Laterality: N/A;  . MELANOMA EXCISION Left 2016   Dr Aubery Lapping  . POLYPECTOMY  02/16/2017   Procedure: POLYPECTOMY;  Surgeon: Lucilla Lame, MD;  Location: [place];  Service: Endoscopy;;  . UPPER GI ENDOSCOPY  2012       Home Medications    Prior to Admission medications   Medication Sig Start Date End Date Taking? Authorizing Provider  allopurinol (ZYLOPRIM) 300 MG tablet Take 300 mg by mouth daily. 07/13/15  Yes [provider]  ALPRAZolam Duanne Moron) 1 MG tablet Take 0.5 mg by mouth at bedtime as needed for anxiety.   Yes [provider]  colchicine 0.6 MG tablet Take 0.6 mg by mouth as needed.   Yes [provider]  dexlansoprazole (DEXILANT) 60 MG capsule Take by mouth.   Yes [provider]  Diclofenac Sodium (PENNSAID) 2 % SOLN Place 2 application 2 (two) times daily onto the skin. 02/14/17  Yes Lyndal Pulley, DO  ibuprofen (ADVIL,MOTRIN) 800 MG tablet Take 1 tablet (800 mg total) by mouth 3 (three) times daily. 04/16/16  Yes Melynda Ripple, MD  lisinopril (PRINIVIL,ZESTRIL) 20 MG tablet Take 20 mg by mouth daily. 07/13/15  Yes [provider]  tadalafil (CIALIS) 20 MG tablet  03/20/15  Yes [provider]  Testosterone Cypionate 100 MG/ML SOLN Inject as directed.   Yes  [provider]  azithromycin (ZITHROMAX) 250 MG tablet Take 1 tablet (250 mg total) by mouth daily. Take first 2 tablets together, then 1 every day until finished. 02/08/18   Lorin Picket, PA-C  benzonatate (TESSALON) 200 MG capsule Take one cap TID PRN cough 02/08/18   Lorin Picket, PA-C    Family History Family History  Problem Relation Age of Onset  . Ovarian cancer Mother   . Leukemia Father     Social History Social History   Tobacco Use  . Smoking status: Former Smoker    Years: 10.00    Types: Cigarettes    Last attempt to quit: 04/11/2005     Years since quitting: 12.8  . Smokeless tobacco: Never Used  Substance Use Topics  . Alcohol use: Yes    Alcohol/week: 14.0 standard drinks    Types: 14 Shots of liquor per week    Comment: 2/day  . Drug use: No     Allergies   Patient has no known allergies.   Review of Systems Review of Systems  Constitutional: Positive for activity change, fatigue and fever. Negative for appetite change and chills.  HENT: Positive for congestion and postnasal drip.   Respiratory: Positive for cough.   All other systems reviewed and are negative.    Physical Exam Triage Vital Signs ED Triage Vitals [02/08/18 1120]  Enc Vitals Group     BP (!) 151/97     Pulse Rate 72     Resp 18     Temp 98.1 F (36.7 C)     Temp Source Oral     SpO2 98 %     Weight 200 lb (90.7 kg)     Height 6' (1.829 m)     Head Circumference      Peak Flow      Pain Score 2     Pain Loc      Pain Edu?      Excl. in [place]?    No data found.  Updated Vital Signs BP (!) 151/97 (BP Location: Left Arm)   Pulse 72   Temp 98.1 F (36.7 C) (Oral)   Resp 18   Ht 6' (1.829 m)   Wt 200 lb (90.7 kg)   SpO2 98%   BMI 27.12 kg/m   Visual Acuity Right Eye Distance:   Left Eye Distance:   Bilateral Distance:    Right Eye Near:   Left Eye Near:    Bilateral Near:     Physical Exam  Constitutional: He is oriented to person, place, and time. He appears well-developed and well-nourished. No distress.  HENT:  Head: Normocephalic.  Right Ear: External ear normal.  Left Ear: External ear normal.  Nose: Nose normal.  Mouth/Throat: Oropharynx is clear and moist. No oropharyngeal exudate.  Eyes: Pupils are equal, round, and reactive to light. Right eye exhibits no discharge. Left eye exhibits no discharge.  Neck: Normal range of motion.  Pulmonary/Chest: Effort normal and breath sounds normal.  Musculoskeletal: Normal range of motion.  Lymphadenopathy:    He has no cervical adenopathy.  Neurological: He is  alert and oriented to person, place, and time.  Skin: Skin is warm and dry. He is not diaphoretic.  Psychiatric: He has a normal mood and affect. His behavior is normal. Judgment and thought content normal.  Nursing note and vitals reviewed.    UC Treatments / Results  Labs (all labs ordered are listed, but only abnormal results are displayed)  Labs Reviewed - No data to display  EKG None  Radiology No results found.  Procedures Procedures (including critical care time)  Medications Ordered in UC Medications - No data to display  Initial Impression / Assessment and Plan / UC Course  I have reviewed the triage vital signs and the nursing notes.  Pertinent labs & imaging results that were available during my care of the patient were reviewed by me and considered in my medical decision making (see chart for details).    Because of the amount of time the patient has had symptoms and not improving I believe that start him on azithromycin also have recommended Flonase along with Tessalon Perles for cough suppression.  Urged him to drink plenty of fluids get enough rest.  Is not improving he should follow-up with his primary care physician for further evaluation         Final Clinical Impressions(s) / UC Diagnoses   Final diagnoses:  Upper respiratory tract infection, unspecified type     Discharge Instructions     Use Flonase daily .2 sprays every day for 2 to 3 weeks to promote drainage.    ED Prescriptions    Medication Sig Dispense Auth. Provider   benzonatate (TESSALON) 200 MG capsule Take one cap TID PRN cough 30 capsule Crecencio Mc P, PA-C   azithromycin (ZITHROMAX) 250 MG tablet Take 1 tablet (250 mg total) by mouth daily. Take first 2 tablets together, then 1 every day until finished. 6 tablet Lorin Picket, PA-C     Controlled Substance Prescriptions [place] Controlled Substance Registry consulted? Not Applicable   Lorin Picket, PA-C 02/08/18  [id number]

## 2018-02-08 NOTE — Discharge Instructions (Signed)
Use Flonase daily .2 sprays every day for 2 to 3 weeks to promote drainage.

## 2018-02-08 NOTE — ED Triage Notes (Signed)
Patient c/o cough, congestion and ear pain x 2 weeks. States he had a fever the first few days but no fever now. Patient states he has been taking Aleve cold and sinus and Nyquil.

## 2018-05-16 ENCOUNTER — Ambulatory Visit: Payer: Self-pay

## 2018-05-16 ENCOUNTER — Ambulatory Visit: Payer: PRIVATE HEALTH INSURANCE | Admitting: Family Medicine

## 2018-05-16 ENCOUNTER — Encounter: Payer: Self-pay | Admitting: Family Medicine

## 2018-05-16 VITALS — BP 122/84 | HR 67 | Ht 72.0 in

## 2018-05-16 DIAGNOSIS — M25561 Pain in right knee: Secondary | ICD-10-CM

## 2018-05-16 DIAGNOSIS — M23311 Other meniscus derangements, anterior horn of medial meniscus, right knee: Secondary | ICD-10-CM | POA: Diagnosis not present

## 2018-05-16 MED ORDER — VITAMIN D (ERGOCALCIFEROL) 1.25 MG (50000 UNIT) PO CAPS: 50000.0000 [IU] | ORAL_CAPSULE | ORAL | 0 refills | Status: DC

## 2018-05-16 MED ORDER — DICLOFENAC SODIUM 2 % TD SOLN: 2.0000 g | Freq: Two times a day (BID) | TRANSDERMAL | 3 refills | Status: DC

## 2018-05-16 NOTE — Progress Notes (Signed)
[facility] [address], [place] [id number] Phone: [phone] Subjective:    I'm seeing this patient by the request  of:    CC:   [id number]    Update 05/16/2018:  Dwayne Hunt is a 63 y.o. male coming in with complaint of right knee pain. 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 Pain over VMO. Hurts with driving. Has been using Advil for pain. Pain is improving and hasn't bothered him as much in the last week.       Past Medical History:  Diagnosis Date  . GERD (gastroesophageal reflux disease)   . Gout   . Hypertension   . Melanoma ([place]) 2016   left shoulder   Past Surgical History:  Procedure Laterality Date  . BIOPSY SHOULDER Right 02/10/2016   invasive melanoma  . COLONOSCOPY  2012  . COLONOSCOPY WITH PROPOFOL N/A 02/16/2017   Procedure: COLONOSCOPY WITH PROPOFOL;  Surgeon: Lucilla Lame, MD;  Location: [place];  Service: Endoscopy;  Laterality: N/A;  . ESOPHAGOGASTRODUODENOSCOPY (EGD) WITH PROPOFOL N/A 02/16/2017   Procedure: ESOPHAGOGASTRODUODENOSCOPY (EGD) WITH PROPOFOL;  Surgeon: Lucilla Lame, MD;  Location: [place];  Service: Endoscopy;  Laterality: N/A;  . MELANOMA EXCISION Left 2016   Dr Aubery Lapping  . POLYPECTOMY  02/16/2017   Procedure: POLYPECTOMY;  Surgeon: Lucilla Lame, MD;  Location: [place];  Service: Endoscopy;;  . UPPER GI ENDOSCOPY  2012   Social History   Socioeconomic History  . Marital status: Divorced    Spouse name: Not on file  . Number of children: Not on file  . Years of education: Not on file  . Highest education level: Not on file  Occupational History  . Not on file  Social Needs  . Financial resource strain: Not on file  . Food insecurity:    Worry: Not on file    Inability: Not on file  . Transportation needs:    Medical: Not on file    Non-medical: Not on file  Tobacco Use  . Smoking status: Former Smoker    Years: 10.00    Types:  Cigarettes    Last attempt to quit: 04/11/2005    Years since quitting: 13.1  . Smokeless tobacco: Never Used  Substance and Sexual Activity  . Alcohol use: Yes    Alcohol/week: 14.0 standard drinks    Types: 14 Shots of liquor per week    Comment: 2/day  . Drug use: No  . Sexual activity: Not on file  Lifestyle  . Physical activity:    Days per week: Not on file    Minutes per session: Not on file  . Stress: Not on file  Relationships  . Social connections:    Talks on phone: Not on file    Gets together: Not on file    Attends religious service: Not on file    Active member of club or organization: Not on file    Attends meetings of clubs or organizations: Not on file    Relationship status: Not on file  Other Topics Concern  . Not on file  Social History Narrative  . Not on file   No Known Allergies Family History  Problem Relation Age of Onset  . Ovarian cancer Mother   . Leukemia Father     Current Outpatient Medications (Endocrine & Metabolic):  Marland Kitchen  Testosterone Cypionate 100 MG/ML SOLN, Inject as directed.  Current Outpatient Medications (Cardiovascular):  .  lisinopril (PRINIVIL,ZESTRIL) 20 MG tablet, Take 20 mg by mouth daily. .  tadalafil (CIALIS) 20 MG tablet,   Current Outpatient Medications (Respiratory):  .  benzonatate (TESSALON) 200 MG capsule, Take one cap TID PRN cough  Current Outpatient Medications (Analgesics):  .  allopurinol (ZYLOPRIM) 300 MG tablet, Take 300 mg by mouth daily. .  colchicine 0.6 MG tablet, Take 0.6 mg by mouth as needed. Marland Kitchen  ibuprofen (ADVIL,MOTRIN) 800 MG tablet, Take 1 tablet (800 mg total) by mouth 3 (three) times daily.   Current Outpatient Medications (Other):  Marland Kitchen  ALPRAZolam (XANAX) 1 MG tablet, Take 0.5 mg by mouth at bedtime as needed for anxiety. Marland Kitchen  azithromycin (ZITHROMAX) 250 MG tablet, Take 1 tablet (250 mg total) by mouth daily. Take first 2 tablets together, then 1 every day until finished. .  dexlansoprazole  (DEXILANT) 60 MG capsule, Take by mouth. .  Diclofenac Sodium (PENNSAID) 2 % SOLN, Place 2 application 2 (two) times daily onto the skin. .  Diclofenac Sodium 2 % SOLN, Place 2 g onto the skin 2 (two) times daily. .  Vitamin D, Ergocalciferol, (DRISDOL) 1.25 MG (50000 UT) CAPS capsule, Take 1 capsule (50,000 Units total) by mouth every 7 (seven) days.    Past medical history, social, surgical and family history all reviewed in electronic medical record.  No pertanent information unless stated regarding to the chief complaint.   Review of Systems:  No headache, visual changes, nausea, vomiting, diarrhea, constipation, dizziness, abdominal pain, skin rash, fevers, chills, night sweats, weight loss, swollen lymph nodes, body aches, joint swelling, muscle aches, chest pain, shortness of breath, mood changes.   Objective  Blood pressure 122/84, pulse 67, height 6' (1.829 m), SpO2 95 %.    General: No apparent distress alert and oriented x3 mood and affect normal, dressed appropriately.  HEENT: Pupils equal, extraocular movements intact  Respiratory: Patient's speak in full sentences and does not appear short of breath  Cardiovascular: No lower extremity edema, non tender, no erythema  Skin: Warm dry intact with no signs of infection or rash on extremities or on axial skeleton.  Abdomen: Soft nontender  Neuro: Cranial nerves II through XII are intact, neurovascularly intact in all extremities with 2+ DTRs and 2+ pulses.  Lymph: No lymphadenopathy of posterior or anterior cervical chain or axillae bilaterally.  Gait normal with good balance and coordination.  MSK:  Non tender with full range of motion and good stability and symmetric strength and tone of shoulders, elbows, wrist, hip and ankles bilaterally.  Knee: Right  normal to inspection with no erythema or effusion or obvious bony abnormalities. Pain over the medial joint line ROM full in flexion and extension and lower leg  rotation. Ligaments with solid consistent endpoints including ACL, PCL, LCL, MCL. Positive Mcmurray's, Apley's, and Thessalonian tests. Mild painful patellar compression. Patellar glide with mild crepitus. Patellar and quadriceps tendons unremarkable. Hamstring and quadriceps strength is normal. Contralateral knee unremarkable  MSK US performed of: Right knee This study was ordered, performed, and interpreted by Charlann Boxer D.O.  Knee: Medial joint line has very minimal narrowing the patient does have a very large what appears to be acute on chronic medial meniscal tear noted trace effusion noted.  IMPRESSION: Medial meniscal tear    Impression and Recommendations:     This case required medical decision making of moderate complexity. The above documentation has been reviewed and is accurate and complete Lyndal Pulley, DO       Note: This  dictation was prepared with Dragon dictation along with smaller phrase technology. Any transcriptional errors that result from this process are unintentional.

## 2018-05-16 NOTE — Assessment & Plan Note (Signed)
.    Medial meniscal tear.  Acute on chronic.  Having some mild locking.  Patient wants to try conservative therapy.  Discussed icing regimen and home exercise.  We discussed which activities to do which was to avoid.  Icing regimen.  Follow-up again 4 to 8 weeks

## 2018-05-16 NOTE — Patient Instructions (Signed)
Good to see you  Meniscal tear Ice 20 minutes 2 times daily. Usually after activity and before bed. pennsaid pinkie amount topically 2 times daily as needed.  Avoid any twisting motion  No golf yet and apologize to the girlfriend for now  See me again in 4 weeks

## 2018-05-23 ENCOUNTER — Ambulatory Visit: Payer: PRIVATE HEALTH INSURANCE | Admitting: Gastroenterology

## 2018-05-23 ENCOUNTER — Other Ambulatory Visit: Payer: Self-pay

## 2018-05-23 ENCOUNTER — Encounter: Payer: Self-pay | Admitting: Gastroenterology

## 2018-05-23 VITALS — BP 132/73 | HR 73 | Ht 72.0 in | Wt 204.8 lb

## 2018-05-23 DIAGNOSIS — R2991 Unspecified symptoms and signs involving the musculoskeletal system: Secondary | ICD-10-CM

## 2018-05-23 NOTE — Progress Notes (Unsigned)
su

## 2018-05-23 NOTE — Progress Notes (Signed)
Primary Care Physician: [name], MD  Primary Gastroenterologist:  Dr. Lucilla Lame  Chief Complaint  Patient presents with  . Abdominal Pain    HPI: Dwayne Hunt is a 63 y.o. male here with a report of left lower quadrant pain.  The patient states that the pain is usually present when he wakes up in the morning.  The patient used to work out a lot but states that he has not been exercising for some time.  The patient denies that food makes his pain any better or worse.  He states that it is a low level pain that is present there all the time but mostly in the morning.  There is no report of any unexplained weight loss fevers chills nausea vomiting.  The patient does report that he has some soft bowel movements that have been chronic for some time.  The patient had a colonoscopy with adenomatous polyps and is on a 5-year schedule.  He reports that when he does move his bowels he has to go back sometime later to wipe again because of residual stools.  He states that the consistency of his stools are typically like peanut butter.  He feels that the pain in the left lower quadrant is more consistent with him feeling like he has to pass gas but is not able to.  He also reports that the pain sometimes is in the right side.  Current Outpatient Medications  Medication Sig Dispense Refill  . allopurinol (ZYLOPRIM) 300 MG tablet Take 300 mg by mouth daily.  12  . ALPRAZolam (XANAX) 1 MG tablet Take 0.5 mg by mouth at bedtime as needed for anxiety.    . colchicine 0.6 MG tablet Take 0.6 mg by mouth as needed.    [name] (DEXILANT) 60 MG capsule Take by mouth.    . Diclofenac Sodium (PENNSAID) 2 % SOLN Place 2 application 2 (two) times daily onto the skin. 112 g 3  . Diclofenac Sodium 2 % SOLN Place 2 g onto the skin 2 (two) times daily. 112 g 3  . ibuprofen (ADVIL,MOTRIN) 800 MG tablet Take 1 tablet (800 mg total) by mouth 3 (three) times daily. 30 tablet 0  . lisinopril  (PRINIVIL,ZESTRIL) 20 MG tablet Take 20 mg by mouth daily.  4  . tadalafil (CIALIS) 20 MG tablet     . Testosterone Cypionate 100 MG/ML SOLN Inject as directed.    . Vitamin D, Ergocalciferol, (DRISDOL) 1.25 MG (50000 UT) CAPS capsule Take 1 capsule (50,000 Units total) by mouth every 7 (seven) days. 12 capsule 0   No current facility-administered medications for this visit.     Allergies as of 05/23/2018 - Review Complete 05/23/2018  Allergen Reaction Noted  . Amoxicillin Rash 11/17/2016    ROS:  General: Negative for anorexia, weight loss, fever, chills, fatigue, weakness. ENT: Negative for hoarseness, difficulty swallowing , nasal congestion. CV: Negative for chest pain, angina, palpitations, dyspnea on exertion, peripheral edema.  Respiratory: Negative for dyspnea at rest, dyspnea on exertion, cough, sputum, wheezing.  GI: See history of present illness. GU:  Negative for dysuria, hematuria, urinary incontinence, urinary frequency, nocturnal urination.  Endo: Negative for unusual weight change.    Physical Examination:   BP 132/73   Pulse 73   Ht 6' (1.829 m)   Wt 204 lb 12.8 oz (92.9 kg)   BMI 27.78 kg/m   General: Well-nourished, well-developed in no acute distress.  Eyes: No icterus. Conjunctivae pink. Mouth:  Oropharyngeal mucosa moist and pink , no lesions erythema or exudate. Lungs: Clear to auscultation bilaterally. Non-labored. Heart: Regular rate and rhythm, no murmurs rubs or gallops.  Abdomen: Bowel sounds are normal, as it is reproducible tenderness with one finger palpation while flexing the abdominal wall muscles, nondistended, no hepatosplenomegaly or masses, no abdominal bruits or hernia , no rebound or guarding.   Extremities: No lower extremity edema. No clubbing or deformities. Neuro: Alert and oriented x 3.  Grossly intact. Skin: Warm and dry, no jaundice.   Psych: Alert and cooperative, normal mood and affect.  Labs:    Imaging Studies: No results  found.  Assessment and Plan:   [name] is a 63 y.o. y/o male with a history of soft stools and he will be put on a high-fiber diet and take fiber supplementation.  The patient has been given samples of fiber.  The patient has also been told that his pain in his right and left lower quadrant are consistent with musculoskeletal and reproducible with flexion of the abdominal wall muscles.  The patient has been told to use NSAIDs with food and warm compresses.  The patient has also been told not to strain his abdominal wall muscles which can make it worse.  The patient clearly has residual increased pain after examining his belly with raising his legs above the exam table.  The patient has been explained the plan and agrees with it.    Lucilla Lame, MD. Marval Regal   Note: This dictation was prepared with Dragon dictation along with smaller phrase technology. Any transcriptional errors that result from this process are unintentional.

## 2018-06-13 ENCOUNTER — Encounter: Payer: Self-pay | Admitting: Family Medicine

## 2018-06-13 ENCOUNTER — Ambulatory Visit: Payer: PRIVATE HEALTH INSURANCE | Admitting: Family Medicine

## 2018-06-13 ENCOUNTER — Ambulatory Visit (INDEPENDENT_AMBULATORY_CARE_PROVIDER_SITE_OTHER)
Admission: RE | Admit: 2018-06-13 | Discharge: 2018-06-13 | Disposition: A | Discharge status: Home / Self Care | Payer: PRIVATE HEALTH INSURANCE | Source: Ambulatory Visit | Attending: Family Medicine | Admitting: Family Medicine

## 2018-06-13 VITALS — BP 130/72 | HR 65 | Ht 72.0 in | Wt 205.0 lb

## 2018-06-13 DIAGNOSIS — M545 Low back pain, unspecified: Secondary | ICD-10-CM

## 2018-06-13 DIAGNOSIS — M533 Sacrococcygeal disorders, not elsewhere classified: Secondary | ICD-10-CM | POA: Diagnosis not present

## 2018-06-13 DIAGNOSIS — G8929 Other chronic pain: Secondary | ICD-10-CM | POA: Diagnosis not present

## 2018-06-13 NOTE — Assessment & Plan Note (Signed)
Symptoms consistent with more of a sacroiliac dysfunction.  Discussed with patient about home exercise, icing regimen, which activities to do which wants to avoid.  Work with Product/process development scientist to work on hip abductor strengthening and stability of the back.  Follow-up again in 4 to 8 weeks

## 2018-06-13 NOTE — Patient Instructions (Signed)
Good to see you  Xray downstairs Ice is your friend Ice 20 minutes 2 times daily. Usually after activity and before bed. pennsaid pinkie amount topically 2 times daily as needed.  Exercises 3 times a week.  Get back into your routine, that will help more than anything else See me again in 4-5 weeks if not great

## 2018-06-13 NOTE — Progress Notes (Signed)
[facility] [place], [place] [address] Phone: [phone] Subjective:   I Dwayne Hunt am serving as a Education administrator for Dr. Hulan [name].   CC: Right knee pain and right-sided hip pain  [id number]  Dwayne Hunt is a 63 y.o. male coming in with complaint of right knee pain. Sates the knee is much better. Believes he has sciatica. Right sided hip pain. Remember sitting for 2 days for a tournament.   Onset- Few weeks ago Character-dull, throbbing aching pain Aggravating factors-too long or repetitive activity Reliving factors-stopping certain activities but seems to be worse actually after sitting for long amount of time Therapies tried-nothing significant at this time Severity-7 out of 10     Past Medical History:  Diagnosis Date  . GERD (gastroesophageal reflux disease)   . Gout   . Hypertension   . Melanoma ([facility]) 2016   left shoulder   Past Surgical History:  Procedure Laterality Date  . BIOPSY SHOULDER Right 02/10/2016   invasive melanoma  . COLONOSCOPY  2012  . COLONOSCOPY WITH PROPOFOL N/A 02/16/2017   Procedure: COLONOSCOPY WITH PROPOFOL;  Surgeon: Dwayne Lame, MD;  Location: [facility];  Service: Endoscopy;  Laterality: N/A;  . ESOPHAGOGASTRODUODENOSCOPY (EGD) WITH PROPOFOL N/A 02/16/2017   Procedure: ESOPHAGOGASTRODUODENOSCOPY (EGD) WITH PROPOFOL;  Surgeon: Dwayne Lame, MD;  Location: [facility];  Service: Endoscopy;  Laterality: N/A;  . MELANOMA EXCISION Left 2016   Dr Dwayne Hunt  . POLYPECTOMY  02/16/2017   Procedure: POLYPECTOMY;  Surgeon: Dwayne Lame, MD;  Location: [facility];  Service: Endoscopy;;  . UPPER GI ENDOSCOPY  2012   Social History   Socioeconomic History  . Marital status: Divorced    Spouse name: Not on file  . Number of children: Not on file  . Years of education: Not on file  . Highest education level: Not on file  Occupational History  . Not on file  Social  Needs  . Financial resource strain: Not on file  . Food insecurity:    Worry: Not on file    Inability: Not on file  . Transportation needs:    Medical: Not on file    Non-medical: Not on file  Tobacco Use  . Smoking status: Former Smoker    Years: 10.00    Types: Cigarettes    Last attempt to quit: 04/11/2005    Years since quitting: 13.1  . Smokeless tobacco: Never Used  Substance and Sexual Activity  . Alcohol use: Yes    Alcohol/week: 14.0 standard drinks    Types: 14 Shots of liquor per week    Comment: 2/day  . Drug use: No  . Sexual activity: Not on file  Lifestyle  . Physical activity:    Days per week: Not on file    Minutes per session: Not on file  . Stress: Not on file  Relationships  . Social connections:    Talks on phone: Not on file    Gets together: Not on file    Attends religious service: Not on file    Active member of club or organization: Not on file    Attends meetings of clubs or organizations: Not on file    Relationship status: Not on file  Other Topics Concern  . Not on file  Social History Narrative  . Not on file   Allergies  Allergen Reactions  . Amoxicillin Rash   Family History  Problem Relation Age of Onset  .  Ovarian cancer Mother   . Leukemia Father     Current Outpatient Medications (Endocrine & Metabolic):  Dwayne Hunt  Testosterone Cypionate 100 MG/ML SOLN, Inject as directed.  Current Outpatient Medications (Cardiovascular):  .  lisinopril (PRINIVIL,ZESTRIL) 20 MG tablet, Take 20 mg by mouth daily. .  tadalafil (CIALIS) 20 MG tablet,    Current Outpatient Medications (Analgesics):  .  allopurinol (ZYLOPRIM) 300 MG tablet, Take 300 mg by mouth daily. .  colchicine 0.6 MG tablet, Take 0.6 mg by mouth as needed. Dwayne Hunt  ibuprofen (ADVIL,MOTRIN) 800 MG tablet, Take 1 tablet (800 mg total) by mouth 3 (three) times daily.   Current Outpatient Medications (Other):  Dwayne Hunt  ALPRAZolam (XANAX) 1 MG tablet, Take 0.5 mg by mouth at bedtime as  needed for anxiety. Dwayne Hunt  dexlansoprazole (DEXILANT) 60 MG capsule, Take by mouth. .  Diclofenac Sodium (PENNSAID) 2 % SOLN, Place 2 application 2 (two) times daily onto the skin. .  Diclofenac Sodium 2 % SOLN, Place 2 g onto the skin 2 (two) times daily. .  Vitamin D, Ergocalciferol, (DRISDOL) 1.25 MG (50000 UT) CAPS capsule, Take 1 capsule (50,000 Units total) by mouth every 7 (seven) days.    Past medical history, social, surgical and family history all reviewed in electronic medical record.  No pertanent information unless stated regarding to the chief complaint.   Review of Systems:  No headache, visual changes, nausea, vomiting, diarrhea, constipation, dizziness, abdominal pain, skin rash, fevers, chills, night sweats, weight loss, swollen lymph nodes, body aches, joint swelling, muscle aches, chest pain, shortness of breath, mood changes.   Objective  Blood pressure 130/72, pulse 65, height 6' (1.829 m), weight 205 lb (93 kg), SpO2 97 %. Systems examined below as of    General: No apparent distress alert and oriented x3 mood and affect normal, dressed appropriately.  HEENT: Pupils equal, extraocular movements intact  Respiratory: Patient's speak in full sentences and does not appear short of breath  Cardiovascular: No lower extremity edema, non tender, no erythema  Skin: Warm dry intact with no signs of infection or rash on extremities or on axial skeleton.  Abdomen: Soft nontender  Neuro: Cranial nerves II through XII are intact, neurovascularly intact in all extremities with 2+ DTRs and 2+ pulses.  Lymph: No lymphadenopathy of posterior or anterior cervical chain or axillae bilaterally.  Gait normal with good balance and coordination.  MSK:  Non tender with full range of motion and good stability and symmetric strength and tone of shoulders, elbows, wrist, hip, knee and ankles bilaterally.  Back Exam:  Inspection: Unremarkable  Motion: Flexion 45 deg, Extension 25 deg, Side Bending  to 45 deg bilaterally,  Rotation to 45 deg bilaterally  SLR laying: Negative  XSLR laying: Negative  Palpable tenderness: Tender over the sacroiliac joint. FABERCorky Sox. Sensory change: Gross sensation intact to all lumbar and sacral dermatomes.  Reflexes: 2+ at both patellar tendons, 2+ at achilles tendons, Babinski's downgoing.  Strength at foot  Plantar-flexion: 5/5 Dorsi-flexion: 5/5 Eversion: 5/5 Inversion: 5/5  Leg strength  Quad: 5/5 Hamstring: 5/5 Hip flexor: 5/5 Hip abductors: 5/5  Gait unremarkable.  97110; 15 additional minutes spent for Therapeutic exercises as stated in above notes.  This included exercises focusing on stretching, strengthening, with significant focus on eccentric aspects.   Long term goals include an improvement in range of motion, strength, endurance as well as avoiding reinjury. Patient's frequency would include in 1-2 times a day, 3-5 times a week for a duration of 6-12  weeks. Low back exercises that included:  Pelvic tilt/bracing instruction to focus on control of the pelvic girdle and lower abdominal muscles  Glute strengthening exercises, focusing on proper firing of the glutes without engaging the low back muscles Proper stretching techniques for maximum relief for the hamstrings, hip flexors, low back and some rotation where tolerated   Proper technique shown and discussed handout in great detail with ATC.  All questions were discussed and answered.      Impression and Recommendations:     This case required medical decision making of moderate complexity. The above documentation has been reviewed and is accurate and complete Lyndal Pulley, DO       Note: This dictation was prepared with Dragon dictation along with smaller phrase technology. Any transcriptional errors that result from this process are unintentional.

## 2018-07-12 ENCOUNTER — Ambulatory Visit: Payer: PRIVATE HEALTH INSURANCE | Admitting: Family Medicine

## 2018-08-14 ENCOUNTER — Encounter: Payer: Self-pay | Admitting: Family Medicine

## 2018-08-16 ENCOUNTER — Encounter: Payer: Self-pay | Admitting: Family Medicine

## 2018-08-16 ENCOUNTER — Ambulatory Visit (INDEPENDENT_AMBULATORY_CARE_PROVIDER_SITE_OTHER): Payer: PRIVATE HEALTH INSURANCE | Admitting: Family Medicine

## 2018-08-16 DIAGNOSIS — C439 Malignant melanoma of skin, unspecified: Secondary | ICD-10-CM | POA: Diagnosis not present

## 2018-08-16 DIAGNOSIS — Z85820 Personal history of malignant melanoma of skin: Secondary | ICD-10-CM

## 2018-08-16 DIAGNOSIS — W57XXXA Bitten or stung by nonvenomous insect and other nonvenomous arthropods, initial encounter: Secondary | ICD-10-CM | POA: Diagnosis not present

## 2018-08-16 MED ORDER — DOXYCYCLINE HYCLATE 100 MG PO TABS: 100.0000 mg | ORAL_TABLET | Freq: Two times a day (BID) | ORAL | 0 refills | Status: AC

## 2018-08-16 NOTE — Assessment & Plan Note (Signed)
Patient has a tick bite.  Seems to have maybe a mild smoldering cellulitis.  Doxycycline given today to also treatment for potential Lyme.  No significant central clearing noted.  We discussed worsening redness, fevers or chills that he needs to seek medical attention immediately.  As long as patient does well follow-up with me again as needed.

## 2018-08-16 NOTE — Progress Notes (Signed)
[name] [address] [place], [place] [address] Phone: [phone] Subjective:    Virtual Visit via Video Note  I connected with Dwayne Hunt on 08/16/18 at 12:45 PM EDT by a video enabled telemedicine application and verified that I am speaking with the correct person using two identifiers.  Location: Patient: Patient was in his home residence  provider: I was in the office setting  I discussed the limitations of evaluation and management by telemedicine and the availability of in person appointments. The patient expressed understanding and agreed to proceed.     I discussed the assessment and treatment plan with the patient. The patient was provided an opportunity to ask questions and all were answered. The patient agreed with the plan and demonstrated an understanding of the instructions.   The patient was advised to call back or seek an in-person evaluation if the symptoms worsen or if the condition fails to improve as anticipated.  I provided *18 minutes of face-to-face time during this encounter.   Lyndal Pulley, DO    CC: Tick bite  [id number]  Dwayne Hunt is a 63 y.o. male coming in with complaint of tick bite.  Noticed Sunday.  Increasing in redness and warmth.  Somewhat tender to palpation.  Denies any fevers chills or any abnormal weight loss.  Denies any muscle aches that are out of the ordinary.     Past Medical History:  Diagnosis Date  . GERD (gastroesophageal reflux disease)   . Gout   . Hypertension   . Melanoma ([facility]) 2016   left shoulder   Past Surgical History:  Procedure Laterality Date  . BIOPSY SHOULDER Right 02/10/2016   invasive melanoma  . COLONOSCOPY  2012  . COLONOSCOPY WITH PROPOFOL N/A 02/16/2017   Procedure: COLONOSCOPY WITH PROPOFOL;  Surgeon: Lucilla Lame, MD;  Location: [facility];  Service: Endoscopy;  Laterality: N/A;  . ESOPHAGOGASTRODUODENOSCOPY (EGD) WITH PROPOFOL N/A 02/16/2017    Procedure: ESOPHAGOGASTRODUODENOSCOPY (EGD) WITH PROPOFOL;  Surgeon: Lucilla Lame, MD;  Location: [facility];  Service: Endoscopy;  Laterality: N/A;  . MELANOMA EXCISION Left 2016   Dr Aubery Lapping  . POLYPECTOMY  02/16/2017   Procedure: POLYPECTOMY;  Surgeon: Lucilla Lame, MD;  Location: [facility];  Service: Endoscopy;;  . UPPER GI ENDOSCOPY  2012   Social History   Socioeconomic History  . Marital status: Divorced    Spouse name: Not on file  . Number of children: Not on file  . Years of education: Not on file  . Highest education level: Not on file  Occupational History  . Not on file  Social Needs  . Financial resource strain: Not on file  . Food insecurity:    Worry: Not on file    Inability: Not on file  . Transportation needs:    Medical: Not on file    Non-medical: Not on file  Tobacco Use  . Smoking status: Former Smoker    Years: 10.00    Types: Cigarettes    Last attempt to quit: 04/11/2005    Years since quitting: 13.3  . Smokeless tobacco: Never Used  Substance and Sexual Activity  . Alcohol use: Yes    Alcohol/week: 14.0 standard drinks    Types: 14 Shots of liquor per week    Comment: 2/day  . Drug use: No  . Sexual activity: Not on file  Lifestyle  . Physical activity:    Days per week: Not on file  Minutes per session: Not on file  . Stress: Not on file  Relationships  . Social connections:    Talks on phone: Not on file    Gets together: Not on file    Attends religious service: Not on file    Active member of club or organization: Not on file    Attends meetings of clubs or organizations: Not on file    Relationship status: Not on file  Other Topics Concern  . Not on file  Social History Narrative  . Not on file   Allergies  Allergen Reactions  . Amoxicillin Rash   Family History  Problem Relation Age of Onset  . Ovarian cancer Mother   . Leukemia Father     Current Outpatient Medications (Endocrine &  Metabolic):  Marland Kitchen  Testosterone Cypionate 100 MG/ML SOLN, Inject as directed.  Current Outpatient Medications (Cardiovascular):  .  lisinopril (PRINIVIL,ZESTRIL) 20 MG tablet, Take 20 mg by mouth daily. .  tadalafil (CIALIS) 20 MG tablet,    Current Outpatient Medications (Analgesics):  .  allopurinol (ZYLOPRIM) 300 MG tablet, Take 300 mg by mouth daily. .  colchicine 0.6 MG tablet, Take 0.6 mg by mouth as needed. Marland Kitchen  ibuprofen (ADVIL,MOTRIN) 800 MG tablet, Take 1 tablet (800 mg total) by mouth 3 (three) times daily.   Current Outpatient Medications (Other):  Marland Kitchen  ALPRAZolam (XANAX) 1 MG tablet, Take 0.5 mg by mouth at bedtime as needed for anxiety. Marland Kitchen  dexlansoprazole (DEXILANT) 60 MG capsule, Take by mouth. .  Diclofenac Sodium (PENNSAID) 2 % SOLN, Place 2 application 2 (two) times daily onto the skin. .  Diclofenac Sodium 2 % SOLN, Place 2 g onto the skin 2 (two) times daily. Marland Kitchen  doxycycline (VIBRA-TABS) 100 MG tablet, Take 1 tablet (100 mg total) by mouth 2 (two) times daily for 14 days. .  Vitamin D, Ergocalciferol, (DRISDOL) 1.25 MG (50000 UT) CAPS capsule, Take 1 capsule (50,000 Units total) by mouth every 7 (seven) days.    Past medical history, social, surgical and family history all reviewed in electronic medical record.  No pertanent information unless stated regarding to the chief complaint.   Review of Systems:  No headache, visual changes, nausea, vomiting, diarrhea, constipation, dizziness, abdominal pain, skin rash, fevers, chills, night sweats, weight loss, swollen lymph nodes, body aches, joint swelling, muscle aches, chest pain, shortness of breath, mood changes.    Objective    General: No apparent distress alert and oriented x3 mood and affect normal, dressed appropriately.  HEENT: Pupils equal, extraocular movements intact  Respiratory: Patient's speak in full sentences and does not appear short of breath  Patient skin shows some mild erythema around the bite.  No  central clearing noted at the moment.  Possible cellulitis.    Impression and Recommendations:     This case required medical decision making of moderate complexity. The above documentation has been reviewed and is accurate and complete Lyndal Pulley, DO       Note: This dictation was prepared with Dragon dictation along with smaller phrase technology. Any transcriptional errors that result from this process are unintentional.

## 2018-08-16 NOTE — Assessment & Plan Note (Signed)
History of nonmelanoma, is not following up with anyone referral placed

## 2018-10-10 ENCOUNTER — Encounter: Payer: Self-pay | Admitting: Family Medicine

## 2019-02-14 NOTE — Progress Notes (Signed)
[name] [place] [place], [place] [address] Phone: [phone] Subjective:   [name], am serving as a scribe for Dr. Hulan [name].   CC: Right shoulder pain  [id number]     Update 02/15/2019 Dwayne Hunt is a 63 y.o. male coming in with complaint of right shoulder pain for a few months. Patient first noticed pain with washing his hair or grasping steering wheel. Denies any radiating symptoms. Is able to golf without pain. Pain is intermittent but is sharp in nature that will cause him to stop moving his arm overhead or to the side of his body.   Also feels that he pulled his left pec yesterday. Pain with IR.      Past Medical History:  Diagnosis Date  . GERD (gastroesophageal reflux disease)   . Gout   . Hypertension   . Melanoma ([facility]) 2016   left shoulder   Past Surgical History:  Procedure Laterality Date  . BIOPSY SHOULDER Right 02/10/2016   invasive melanoma  . COLONOSCOPY  2012  . COLONOSCOPY WITH PROPOFOL N/A 02/16/2017   Procedure: COLONOSCOPY WITH PROPOFOL;  Surgeon: Lucilla Lame, MD;  Location: [facility];  Service: Endoscopy;  Laterality: N/A;  . ESOPHAGOGASTRODUODENOSCOPY (EGD) WITH PROPOFOL N/A 02/16/2017   Procedure: ESOPHAGOGASTRODUODENOSCOPY (EGD) WITH PROPOFOL;  Surgeon: Lucilla Lame, MD;  Location: [facility];  Service: Endoscopy;  Laterality: N/A;  . MELANOMA EXCISION Left 2016   Dr Aubery Lapping  . POLYPECTOMY  02/16/2017   Procedure: POLYPECTOMY;  Surgeon: Lucilla Lame, MD;  Location: [facility];  Service: Endoscopy;;  . UPPER GI ENDOSCOPY  2012   Social History   Socioeconomic History  . Marital status: Divorced    Spouse name: Not on file  . Number of children: Not on file  . Years of education: Not on file  . Highest education level: Not on file  Occupational History  . Not on file  Social Needs  . Financial resource strain: Not on file  . Food insecurity   Worry: Not on file    Inability: Not on file  . Transportation needs    Medical: Not on file    Non-medical: Not on file  Tobacco Use  . Smoking status: Former Smoker    Years: 10.00    Types: Cigarettes    Quit date: 04/11/2005    Years since quitting: 13.8  . Smokeless tobacco: Never Used  Substance and Sexual Activity  . Alcohol use: Yes    Alcohol/week: 14.0 standard drinks    Types: 14 Shots of liquor per week    Comment: 2/day  . Drug use: No  . Sexual activity: Not on file  Lifestyle  . Physical activity    Days per week: Not on file    Minutes per session: Not on file  . Stress: Not on file  Relationships  . Social Herbalist on phone: Not on file    Gets together: Not on file    Attends religious service: Not on file    Active member of club or organization: Not on file    Attends meetings of clubs or organizations: Not on file    Relationship status: Not on file  Other Topics Concern  . Not on file  Social History Narrative  . Not on file   Allergies  Allergen Reactions  . Amoxicillin Rash   Family History  Problem Relation Age of Onset  . Ovarian  cancer Mother   . Leukemia Father     Current Outpatient Medications (Endocrine & Metabolic):  Marland Kitchen  Testosterone Cypionate 100 MG/ML SOLN, Inject as directed.  Current Outpatient Medications (Cardiovascular):  .  lisinopril (PRINIVIL,ZESTRIL) 20 MG tablet, Take 20 mg by mouth daily. .  tadalafil (CIALIS) 20 MG tablet,   Current Outpatient Medications (Respiratory):  .  fluticasone (FLONASE) 50 MCG/ACT nasal spray, Place 2 sprays into both nostrils daily.  Current Outpatient Medications (Analgesics):  .  allopurinol (ZYLOPRIM) 300 MG tablet, Take 300 mg by mouth daily. .  meloxicam (MOBIC) 15 MG tablet, Take 1 tablet (15 mg total) by mouth daily.   Current Outpatient Medications (Other):  Marland Kitchen  ALPRAZolam (XANAX) 1 MG tablet, Take 0.5 mg by mouth at bedtime as needed for anxiety. .  Diclofenac  Sodium (PENNSAID) 2 % SOLN, Place 2 application 2 (two) times daily onto the skin. .  Vitamin D, Ergocalciferol, (DRISDOL) 1.25 MG (50000 UT) CAPS capsule, Take 1 capsule (50,000 Units total) by mouth every 7 (seven) days.    Past medical history, social, surgical and family history all reviewed in electronic medical record.  No pertanent information unless stated regarding to the chief complaint.   Review of Systems:  No headache, visual changes, nausea, vomiting, diarrhea, constipation, dizziness, abdominal pain, skin rash, fevers, chills, night sweats, weight loss, swollen lymph nodes, body aches, joint swelling, muscle aches, chest pain, shortness of breath, mood changes.   Objective  Blood pressure 120/82, pulse 81, height 6' (1.829 m), weight 205 lb (93 kg), SpO2 98 %.   General: No apparent distress alert and oriented x3 mood and affect normal, dressed appropriately.  HEENT: Pupils equal, extraocular movements intact  Respiratory: Patient's speak in full sentences and does not appear short of breath  Cardiovascular: No lower extremity edema, non tender, no erythema  Skin: Warm dry intact with no signs of infection or rash on extremities or on axial skeleton.  Abdomen: Soft nontender  Neuro: Cranial nerves II through XII are intact, neurovascularly intact in all extremities with 2+ DTRs and 2+ pulses.  Lymph: No lymphadenopathy of posterior or anterior cervical chain or axillae bilaterally.  Gait normal with good balance and coordination.  MSK:  Non tender with full range of motion and good stability and symmetric strength and tone of  elbows, wrist, hip, knee and ankles bilaterally.  Shoulder: Right Inspection reveals no abnormalities, atrophy or asymmetry. Palpation is normal with no tenderness over AC joint or bicipital groove. ROM is full in all planes passively. Rotator cuff strength normal throughout. signs of impingement with positive Neer and Hawkin's tests, but negative  empty can sign. Speeds and Yergason's tests normal. No labral pathology noted with negative Obrien's, negative clunk and good stability. Normal scapular function observed. No painful arc and no drop arm sign. No apprehension sign  MSK US performed of: Right This study was ordered, performed, and interpreted by Charlann Boxer D.O.  Shoulder:   Supraspinatus:  Appears normal on long and transverse views, Bursal bulge seen with shoulder abduction on impingement view. Infraspinatus:  Appears normal on long and transverse views. Significant increase in Doppler flow Subscapularis:  Appears normal on long and transverse views. Positive bursa Teres Minor:  Appears normal on long and transverse views. AC joint:  Capsule undistended, no geyser sign. Glenohumeral Joint:  Appears normal without effusion. Glenoid Labrum:  Intact without visualized tears. Biceps Tendon:  Appears normal on long and transverse views, no fraying of tendon, tendon located in  intertubercular groove, no subluxation with shoulder internal or external rotation.  Impression: Subacromial bursitis  97110; 15 additional minutes spent for Therapeutic exercises as stated in above notes.  This included exercises focusing on stretching, strengthening, with significant focus on eccentric aspects.   Long term goals include an improvement in range of motion, strength, endurance as well as avoiding reinjury. Patient's frequency would include in 1-2 times a day, 3-5 times a week for a duration of 6-12 weeks. Shoulder Exercises that included:  Basic scapular stabilization to include adduction and depression of scapula Scaption, focusing on proper movement and good control Internal and External rotation utilizing a theraband, with elbow tucked at side entire time Rows with theraband   Proper technique shown and discussed handout in great detail with ATC.  All questions were discussed and answered.     Impression and Recommendations:      This case required medical decision making of moderate complexity. The above documentation has been reviewed and is accurate and complete Lyndal Pulley, DO       Note: This dictation was prepared with Dragon dictation along with smaller phrase technology. Any transcriptional errors that result from this process are unintentional.

## 2019-02-15 ENCOUNTER — Ambulatory Visit: Payer: PRIVATE HEALTH INSURANCE | Admitting: Family Medicine

## 2019-02-15 ENCOUNTER — Encounter: Payer: Self-pay | Admitting: Family Medicine

## 2019-02-15 ENCOUNTER — Ambulatory Visit: Payer: Self-pay

## 2019-02-15 VITALS — BP 120/82 | HR 81 | Ht 72.0 in | Wt 205.0 lb

## 2019-02-15 DIAGNOSIS — M7551 Bursitis of right shoulder: Secondary | ICD-10-CM | POA: Insufficient documentation

## 2019-02-15 DIAGNOSIS — M25511 Pain in right shoulder: Secondary | ICD-10-CM

## 2019-02-15 DIAGNOSIS — G8929 Other chronic pain: Secondary | ICD-10-CM | POA: Diagnosis not present

## 2019-02-15 MED ORDER — MELOXICAM 15 MG PO TABS: 15.0000 mg | ORAL_TABLET | Freq: Every day | ORAL | 0 refills | Status: DC

## 2019-02-15 MED ORDER — FLUTICASONE PROPIONATE 50 MCG/ACT NA SUSP: 2.0000 | Freq: Every day | NASAL | 6 refills | Status: DC

## 2019-02-15 NOTE — Patient Instructions (Signed)
  Exercises 3x a week Meloxicam daily for 10 days then as needed Ice 20 min a day  Vitamin D 2000IU daily See me again in 4-5 weeks and we will consider injection if not better

## 2019-02-15 NOTE — Assessment & Plan Note (Signed)
Subacromial bursitis of the shoulder more and is subscapularis.  Discussed with patient in great length.  Discussed the possibility of icing regimen, home exercise, what activities to do which will still avoid.  Meloxicam given.  Worsening symptoms consider injection.  Follow-up again in 4 to 6 weeks

## 2019-02-16 ENCOUNTER — Encounter: Payer: Self-pay | Admitting: Family Medicine

## 2019-03-22 ENCOUNTER — Encounter: Payer: Self-pay | Admitting: Family Medicine

## 2019-03-22 ENCOUNTER — Ambulatory Visit: Payer: Self-pay

## 2019-03-22 ENCOUNTER — Other Ambulatory Visit: Payer: Self-pay

## 2019-03-22 ENCOUNTER — Ambulatory Visit (INDEPENDENT_AMBULATORY_CARE_PROVIDER_SITE_OTHER): Payer: PRIVATE HEALTH INSURANCE | Admitting: Family Medicine

## 2019-03-22 VITALS — BP 110/64 | HR 74 | Ht 72.0 in | Wt 197.0 lb

## 2019-03-22 DIAGNOSIS — M19011 Primary osteoarthritis, right shoulder: Secondary | ICD-10-CM | POA: Diagnosis not present

## 2019-03-22 DIAGNOSIS — G8929 Other chronic pain: Secondary | ICD-10-CM

## 2019-03-22 DIAGNOSIS — M7551 Bursitis of right shoulder: Secondary | ICD-10-CM | POA: Diagnosis not present

## 2019-03-22 DIAGNOSIS — M25511 Pain in right shoulder: Secondary | ICD-10-CM

## 2019-03-22 NOTE — Patient Instructions (Addendum)
  [address], [address] [place],  [address] Phone [phone]  See me in 6-8 weeks

## 2019-03-22 NOTE — Progress Notes (Signed)
[name] [address], [place] [address] Phone: [phone] Subjective:   Dwayne Hunt, am serving as a scribe for Dr. Hulan [name]. This visit occurred during the SARS-CoV-2 public health emergency.  Safety protocols were in place, including screening questions prior to the visit, additional usage of staff PPE, and extensive cleaning of exam room while observing appropriate contact time as indicated for disinfecting solutions.   I'm seeing this patient by the request  of:    CC: Shoulder pain follow-up  [id number]   02/15/2019 Subacromial bursitis of the shoulder more and is subscapularis.  Discussed with patient in great length.  Discussed the possibility of icing regimen, home exercise, what activities to do which will still avoid.  Meloxicam given.  Worsening symptoms consider injection.  Follow-up again in 4 to 6 weeks  Update 03/22/2019 Dwayne Hunt is a 63 y.o. male coming in with complaint of right shoulder pain. Patient states that his pain is worse than last visit. Finished the meloxicam on Sunday. Feels that his lack of activity and sleeping on right shoulder is causing an increasing. Denies any numbness or tingling.  Still able to sleep fairly comfortably, doing activities of daily living.     Past Medical History:  Diagnosis Date  . GERD (gastroesophageal reflux disease)   . Gout   . Hypertension   . Melanoma ([place]) 2016   left shoulder   Past Surgical History:  Procedure Laterality Date  . BIOPSY SHOULDER Right 02/10/2016   invasive melanoma  . COLONOSCOPY  2012  . COLONOSCOPY WITH PROPOFOL N/A 02/16/2017   Procedure: COLONOSCOPY WITH PROPOFOL;  Surgeon: Lucilla Lame, MD;  Location: [facility];  Service: Endoscopy;  Laterality: N/A;  . ESOPHAGOGASTRODUODENOSCOPY (EGD) WITH PROPOFOL N/A 02/16/2017   Procedure: ESOPHAGOGASTRODUODENOSCOPY (EGD) WITH PROPOFOL;  Surgeon: Lucilla Lame, MD;  Location: [facility];  Service: Endoscopy;  Laterality: N/A;  . MELANOMA EXCISION Left 2016   Dr Aubery Lapping  . POLYPECTOMY  02/16/2017   Procedure: POLYPECTOMY;  Surgeon: Lucilla Lame, MD;  Location: [facility];  Service: Endoscopy;;  . UPPER GI ENDOSCOPY  2012   Social History   Socioeconomic History  . Marital status: Divorced    Spouse name: Not on file  . Number of children: Not on file  . Years of education: Not on file  . Highest education level: Not on file  Occupational History  . Not on file  Tobacco Use  . Smoking status: Former Smoker    Years: 10.00    Types: Cigarettes    Quit date: 04/11/2005    Years since quitting: 13.9  . Smokeless tobacco: Never Used  Substance and Sexual Activity  . Alcohol use: Yes    Alcohol/week: 14.0 standard drinks    Types: 14 Shots of liquor per week    Comment: 2/day  . Drug use: Hunt  . Sexual activity: Not on file  Other Topics Concern  . Not on file  Social History Narrative  . Not on file   Social Determinants of Health   Financial Resource Strain:   . Difficulty of Paying Living Expenses: Not on file  Food Insecurity:   . Worried About Charity fundraiser in the Last Year: Not on file  . Ran Out of Food in the Last Year: Not on file  Transportation Needs:   . Lack of Transportation (Medical): Not on file  . Lack of Transportation (Non-Medical): Not on file  Physical Activity:   . Days of Exercise per Week: Not on file  . Minutes of Exercise per Session: Not on file  Stress:   . Feeling of Stress : Not on file  Social Connections:   . Frequency of Communication with Friends and Family: Not on file  . Frequency of Social Gatherings with Friends and Family: Not on file  . Attends Religious Services: Not on file  . Active Member of Clubs or Organizations: Not on file  . Attends Archivist Meetings: Not on file  . Marital Status: Not on file   Allergies  Allergen Reactions  . Amoxicillin Rash   Family History   Problem Relation Age of Onset  . Ovarian cancer Mother   . Leukemia Father     Current Outpatient Medications (Endocrine & Metabolic):  Marland Kitchen  Testosterone Cypionate 100 MG/ML SOLN, Inject as directed.  Current Outpatient Medications (Cardiovascular):  .  lisinopril (PRINIVIL,ZESTRIL) 20 MG tablet, Take 20 mg by mouth daily. .  tadalafil (CIALIS) 20 MG tablet,   Current Outpatient Medications (Respiratory):  .  fluticasone (FLONASE) 50 MCG/ACT nasal spray, Place 2 sprays into both nostrils daily.  Current Outpatient Medications (Analgesics):  .  allopurinol (ZYLOPRIM) 300 MG tablet, Take 300 mg by mouth daily. .  meloxicam (MOBIC) 15 MG tablet, Take 1 tablet (15 mg total) by mouth daily.   Current Outpatient Medications (Other):  Marland Kitchen  ALPRAZolam (XANAX) 1 MG tablet, Take 0.5 mg by mouth at bedtime as needed for anxiety. .  Diclofenac Sodium (PENNSAID) 2 % SOLN, Place 2 application 2 (two) times daily onto the skin. .  Vitamin D, Ergocalciferol, (DRISDOL) 1.25 MG (50000 UT) CAPS capsule, Take 1 capsule (50,000 Units total) by mouth every 7 (seven) days.    Past medical history, social, surgical and family history all reviewed in electronic medical record.  Hunt pertanent information unless stated regarding to the chief complaint.   Review of Systems:  Hunt headache, visual changes, nausea, vomiting, diarrhea, constipation, dizziness, abdominal pain, skin rash, fevers, chills, night sweats, weight loss, swollen lymph nodes, body aches, joint swelling, , chest pain, shortness of breath, mood changes.  Positive muscle aches Objective  Blood pressure 110/64, pulse 74, height 6' (1.829 m), weight 197 lb (89.4 kg), SpO2 96 %.    General: Hunt apparent distress alert and oriented x3 mood and affect normal, dressed appropriately.  HEENT: Pupils equal, extraocular movements intact  Respiratory: Patient's speak in full sentences and does not appear short of breath  Cardiovascular: Hunt lower extremity  edema, non tender, Hunt erythema  Skin: Warm dry intact with Hunt signs of infection or rash on extremities or on axial skeleton.  Abdomen: Soft nontender  Neuro: Cranial nerves II through XII are intact, neurovascularly intact in all extremities with 2+ DTRs and 2+ pulses.  Lymph: Hunt lymphadenopathy of posterior or anterior cervical chain or axillae bilaterally.  Gait normal with good balance and coordination.  MSK:  Non tender with full range of motion and good stability and symmetric strength and tone of , elbows, wrist, hip, knee and ankles bilaterally.  Shoulder: Right Inspection reveals Hunt abnormalities, atrophy or asymmetry. Palpation is normal with Hunt tenderness over AC joint or bicipital groove. ROM is full in all planes passively. Rotator cuff strength 4+ out of 5 compared to the contralateral side signs of impingement with positive Neer and Hawkin's tests, but negative empty can sign. Speeds and Yergason's tests normal. Mild positive O'Brien's and positive crossover Normal scapular  function observed. Hunt painful arc and Hunt drop arm sign. Hunt apprehension sign    Procedure: Real-time Ultrasound Guided Injection of right glenohumeral joint Device: GE Logiq E  Ultrasound guided injection is preferred based studies that show increased duration, increased effect, greater accuracy, decreased procedural pain, increased response rate with ultrasound guided versus blind injection.  Verbal informed consent obtained.  Time-out conducted.  Noted Hunt overlying erythema, induration, or other signs of local infection.  Skin prepped in a sterile fashion.  Local anesthesia: Topical Ethyl chloride.  With sterile technique and under real time ultrasound guidance:  Joint visualized.  23g 1  inch needle inserted posterior approach. Pictures taken for needle placement. Patient did have injection of 2 cc of 1% lidocaine, 2 cc of 0.5% Marcaine, and 1.0 cc of Kenalog 40 mg/dL. Completed without difficulty    Pain immediately resolved suggesting accurate placement of the medication.  Advised to call if fevers/chills, erythema, induration, drainage, or persistent bleeding.  Images permanently stored and available for review in the ultrasound unit.  Impression: Technically successful ultrasound guided injection.  Procedure: Real-time Ultrasound Guided Injection of right acromioclavicular joint Device: GE Logiq Q7 Ultrasound guided injection is preferred based studies that show increased duration, increased effect, greater accuracy, decreased procedural pain, increased response rate, and decreased cost with ultrasound guided versus blind injection.  Verbal informed consent obtained.  Time-out conducted.  Noted Hunt overlying erythema, induration, or other signs of local infection.  Skin prepped in a sterile fashion.  Local anesthesia: Topical Ethyl chloride.  With sterile technique and under real time ultrasound guidance: With a 25-gauge half inch needle injected with 0.5 cc of 0.5% Marcaine and 0.5 cc of Kenalog 40 mg/mL Completed without difficulty  Pain immediately resolved suggesting accurate placement of the medication.  Advised to call if fevers/chills, erythema, induration, drainage, or persistent bleeding.  Images permanently stored and available for review in the ultrasound unit.  Impression: Technically successful ultrasound guided injection.    Impression and Recommendations:     This case required medical decision making of moderate complexity. The above documentation has been reviewed and is accurate and complete Lyndal Pulley, DO       Note: This dictation was prepared with Dragon dictation along with smaller phrase technology. Any transcriptional errors that result from this process are unintentional.

## 2019-03-23 ENCOUNTER — Encounter: Payer: Self-pay | Admitting: Family Medicine

## 2019-03-23 DIAGNOSIS — M19019 Primary osteoarthritis, unspecified shoulder: Secondary | ICD-10-CM | POA: Insufficient documentation

## 2019-03-23 NOTE — Assessment & Plan Note (Signed)
Injection given, tolerated procedure well, discussed which activities to do which wants to avoid.  History of gout and may need to consider monitoring as well for the shoulder.  Follow-up again in 4 to 8 weeks

## 2019-03-23 NOTE — Assessment & Plan Note (Signed)
Patient given injection today.  Tolerated the procedure well.  Underlying gout could be contributing to some of the discomfort and pain as well.  Discussed which activities to do which wants to avoid.  Follow-up again in 4 to 8 weeks

## 2019-03-26 ENCOUNTER — Encounter: Payer: Self-pay | Admitting: Family Medicine

## 2019-03-29 ENCOUNTER — Other Ambulatory Visit: Payer: Self-pay | Admitting: Family Medicine

## 2019-04-21 ENCOUNTER — Other Ambulatory Visit: Payer: Self-pay | Admitting: Family Medicine

## 2019-04-26 ENCOUNTER — Encounter: Payer: Self-pay | Admitting: Family Medicine

## 2019-05-09 ENCOUNTER — Ambulatory Visit: Payer: PRIVATE HEALTH INSURANCE | Admitting: Family Medicine

## 2019-05-16 ENCOUNTER — Encounter: Payer: Self-pay | Admitting: Family Medicine

## 2019-05-16 ENCOUNTER — Other Ambulatory Visit: Payer: Self-pay

## 2019-05-16 ENCOUNTER — Ambulatory Visit (INDEPENDENT_AMBULATORY_CARE_PROVIDER_SITE_OTHER): Payer: PRIVATE HEALTH INSURANCE | Admitting: Family Medicine

## 2019-05-16 DIAGNOSIS — M19011 Primary osteoarthritis, right shoulder: Secondary | ICD-10-CM | POA: Diagnosis not present

## 2019-05-16 DIAGNOSIS — M7551 Bursitis of right shoulder: Secondary | ICD-10-CM | POA: Diagnosis not present

## 2019-05-16 DIAGNOSIS — M542 Cervicalgia: Secondary | ICD-10-CM | POA: Diagnosis not present

## 2019-05-16 NOTE — Patient Instructions (Signed)
Gabapentin 200mg  at night Use TENS and traction See me in 2 months but send me message in 2 weeks

## 2019-05-16 NOTE — Assessment & Plan Note (Signed)
Do believe the patient's neck pain is likely contributing as well. Patient did not respond as well to osteopathic manipulation previously. Discussed posture and ergonomics, follow-up again in 4 to 6 weeks

## 2019-05-16 NOTE — Progress Notes (Signed)
[place] Phone: [phone] Subjective:    I'm seeing this patient by the request  of:  [name], MD  CC: Shoulder and neck pain follow-up  [id number]   03/22/2019 Patient given injection today.  Tolerated the procedure well.  Underlying gout could be contributing to some of the discomfort and pain as well.  Discussed which activities to do which wants to avoid.  Follow-up again in 4 to 8 weeks  Update 05/16/2019 Dwayne Hunt is a 64 y.o. male coming in with complaint of right shoulder pain, AC arthritis. Patient states that he has been having numbness and tingling in his right hand and fingers since last visit. Shoulder pain has decreased but he hasn't been using his arms as much. Ice has been helping with pain and tingling.      Past Medical History:  Diagnosis Date  . GERD (gastroesophageal reflux disease)   . Gout   . Hypertension   . Melanoma ([place]) 2016   left shoulder   Past Surgical History:  Procedure Laterality Date  . BIOPSY SHOULDER Right 02/10/2016   invasive melanoma  . COLONOSCOPY  2012  . COLONOSCOPY WITH PROPOFOL N/A 02/16/2017   Procedure: COLONOSCOPY WITH PROPOFOL;  Surgeon: Lucilla Lame, MD;  Location: [facility];  Service: Endoscopy;  Laterality: N/A;  . ESOPHAGOGASTRODUODENOSCOPY (EGD) WITH PROPOFOL N/A 02/16/2017   Procedure: ESOPHAGOGASTRODUODENOSCOPY (EGD) WITH PROPOFOL;  Surgeon: Lucilla Lame, MD;  Location: [facility];  Service: Endoscopy;  Laterality: N/A;  . MELANOMA EXCISION Left 2016   Dr Aubery Lapping  . POLYPECTOMY  02/16/2017   Procedure: POLYPECTOMY;  Surgeon: Lucilla Lame, MD;  Location: [facility];  Service: Endoscopy;;  . UPPER GI ENDOSCOPY  2012   Social History   Socioeconomic History  . Marital status: Divorced    Spouse name: Not on file  . Number of children: Not on file  . Years of education: Not on file  . Highest  education level: Not on file  Occupational History  . Not on file  Tobacco Use  . Smoking status: Former Smoker    Years: 10.00    Types: Cigarettes    Quit date: 04/11/2005    Years since quitting: 14.1  . Smokeless tobacco: Never Used  Substance and Sexual Activity  . Alcohol use: Yes    Alcohol/week: 14.0 standard drinks    Types: 14 Shots of liquor per week    Comment: 2/day  . Drug use: No  . Sexual activity: Not on file  Other Topics Concern  . Not on file  Social History Narrative  . Not on file   Social Determinants of Health   Financial Resource Strain:   . Difficulty of Paying Living Expenses: Not on file  Food Insecurity:   . Worried About Charity fundraiser in the Last Year: Not on file  . Ran Out of Food in the Last Year: Not on file  Transportation Needs:   . Lack of Transportation (Medical): Not on file  . Lack of Transportation (Non-Medical): Not on file  Physical Activity:   . Days of Exercise per Week: Not on file  . Minutes of Exercise per Session: Not on file  Stress:   . Feeling of Stress : Not on file  Social Connections:   . Frequency of Communication with Friends and Family: Not on file  . Frequency of Social Gatherings with Friends and Family: Not on  file  . Attends Religious Services: Not on file  . Active Member of Clubs or Organizations: Not on file  . Attends Archivist Meetings: Not on file  . Marital Status: Not on file   Allergies  Allergen Reactions  . Amoxicillin Rash   Family History  Problem Relation Age of Onset  . Ovarian cancer Mother   . Leukemia Father     Current Outpatient Medications (Endocrine & Metabolic):  Marland Kitchen  Testosterone Cypionate 100 MG/ML SOLN, Inject as directed.  Current Outpatient Medications (Cardiovascular):  .  lisinopril (PRINIVIL,ZESTRIL) 20 MG tablet, Take 20 mg by mouth daily. .  tadalafil (CIALIS) 20 MG tablet,   Current Outpatient Medications (Respiratory):  .  fluticasone (FLONASE)  50 MCG/ACT nasal spray, Place 2 sprays into both nostrils daily.  Current Outpatient Medications (Analgesics):  .  allopurinol (ZYLOPRIM) 300 MG tablet, Take 300 mg by mouth daily. .  meloxicam (MOBIC) 15 MG tablet, TAKE 1 TABLET BY MOUTH EVERY DAY   Current Outpatient Medications (Other):  Marland Kitchen  ALPRAZolam (XANAX) 1 MG tablet, Take 0.5 mg by mouth at bedtime as needed for anxiety. .  Diclofenac Sodium (PENNSAID) 2 % SOLN, Place 2 application 2 (two) times daily onto the skin. .  Vitamin D, Ergocalciferol, (DRISDOL) 1.25 MG (50000 UT) CAPS capsule, Take 1 capsule (50,000 Units total) by mouth every 7 (seven) days.   Reviewed prior external information including notes and imaging from  primary care provider As well as notes that were available from care everywhere and other healthcare systems.  Past medical history, social, surgical and family history all reviewed in electronic medical record.  No pertanent information unless stated regarding to the chief complaint.   Review of Systems:  No headache, visual changes, nausea, vomiting, diarrhea, constipation, dizziness, abdominal pain, skin rash, fevers, chills, night sweats, weight loss, swollen lymph nodes, body aches, joint swelling, chest pain, shortness of breath, mood changes. POSITIVE muscle aches  Objective  Blood pressure (!) 138/92, pulse 84, height 6' (1.829 m), weight 200 lb (90.7 kg), SpO2 96 %.   General: No apparent distress alert and oriented x3 mood and affect normal, dressed appropriately.  HEENT: Pupils equal, extraocular movements intact  Respiratory: Patient's speak in full sentences and does not appear short of breath  Cardiovascular: No lower extremity edema, non tender, no erythema  Skin: Warm dry intact with no signs of infection or rash on extremities or on axial skeleton.  Abdomen: Soft nontender  Neuro: Cranial nerves II through XII are intact, neurovascularly intact in all extremities with 2+ DTRs and 2+ pulses.    Lymph: No lymphadenopathy of posterior or anterior cervical chain or axillae bilaterally.  Gait normal with good balance and coordination.  MSK: Mild arthritic changes of multiple joints  Neck exam does have some loss of lordosis. Some tender to palpation in the paraspinal musculature lumbar spine. Negative Spurling's though. Patient states that he has numbness in the fingers with good grip strength with no significant muscle atrophy Right shoulder exam still has a positive crossover sign tenderness over the acromioclavicular joint. Less tenderness still with impingement signs of Hawkins and Neer's and good range of motion.   Impression and Recommendations:     The above documentation has been reviewed and is accurate and complete Lyndal Pulley, DO       Note: This dictation was prepared with Dragon dictation along with smaller phrase technology. Any transcriptional errors that result from this process are unintentional.

## 2019-05-16 NOTE — Assessment & Plan Note (Signed)
Continues to have some mild discomfort and pain. Patient has made some progress. I would like patient to continue conservative therapy for another 2 months. If continues to have worsening symptoms we will need to consider the possibility of advanced imaging

## 2019-05-16 NOTE — Assessment & Plan Note (Signed)
Patient seems to be doing somewhat better with the shoulder. 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 Patient still has radicular symptoms but it is more consistent with patient's neck. Discussed icing regimen and home exercises. Patient will increase activity as tolerated. Patient encouraged to take the gabapentin on a more regular basis. Follow-up again in 2 months

## 2019-05-26 ENCOUNTER — Other Ambulatory Visit: Payer: Self-pay | Admitting: Family Medicine

## 2019-06-10 IMAGING — DX DG LUMBAR SPINE COMPLETE 4+V
5 series · 5 of 5 positions shown · non-contrast
Comparison: None

CLINICAL DATA: Low back pain for 2 weeks, no known injury

EXAM:
LUMBAR SPINE - COMPLETE 4+ VIEW

[l-spine ap]
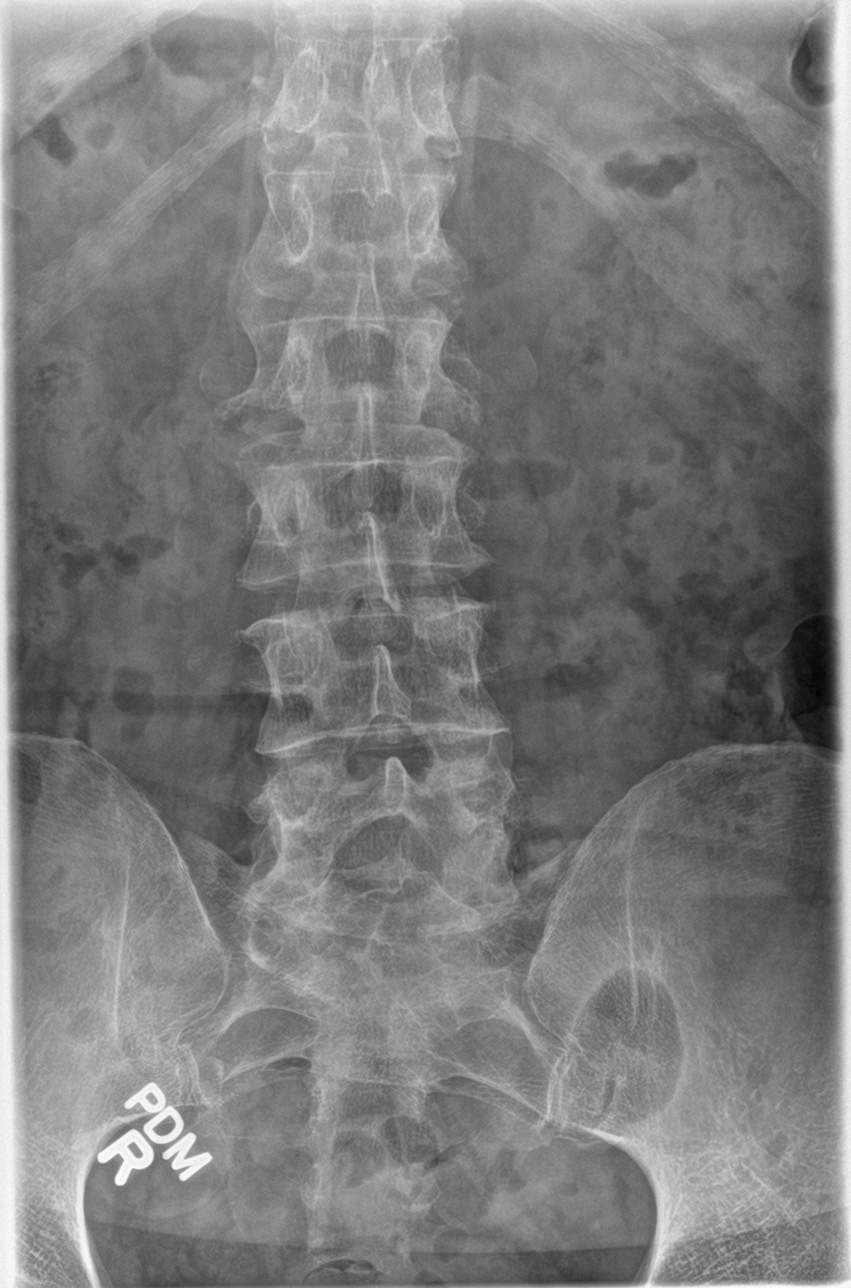

[l-spine obl (1 of 2)]
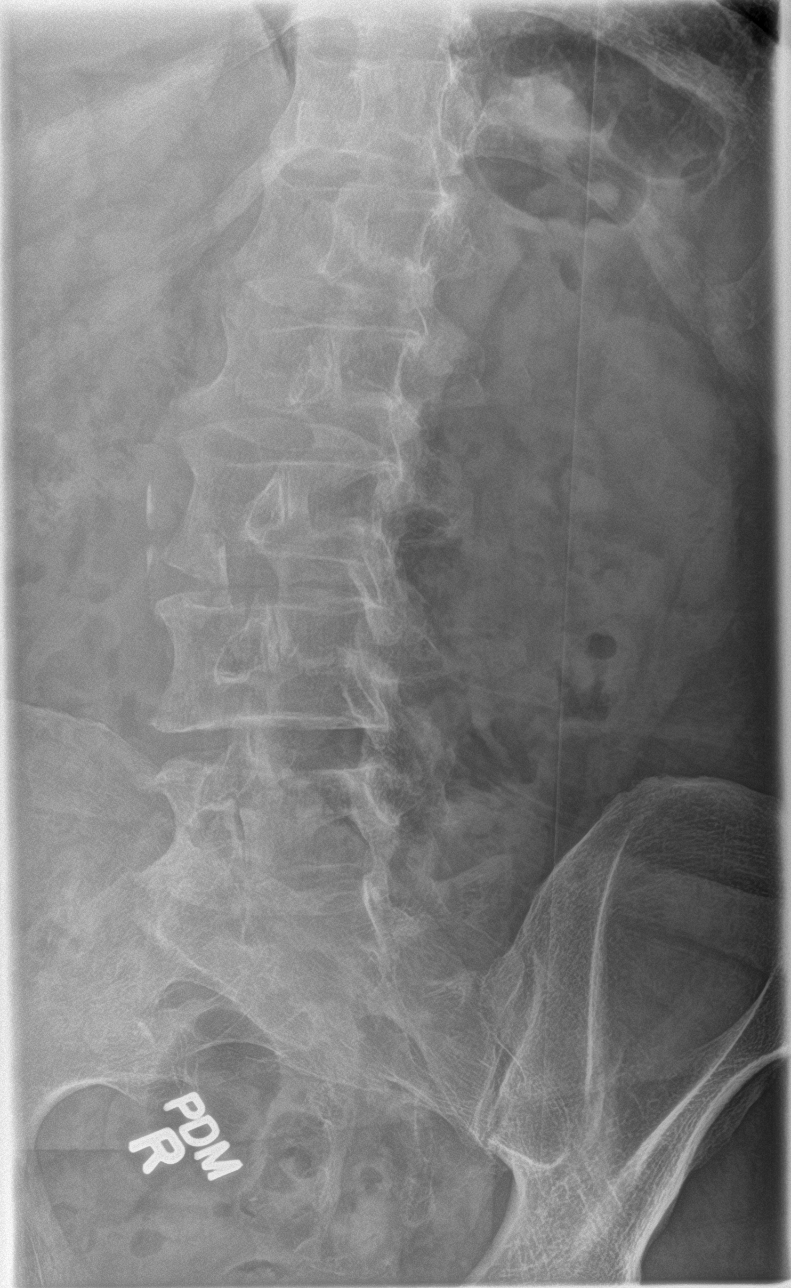

[l-spine obl (2 of 2)]
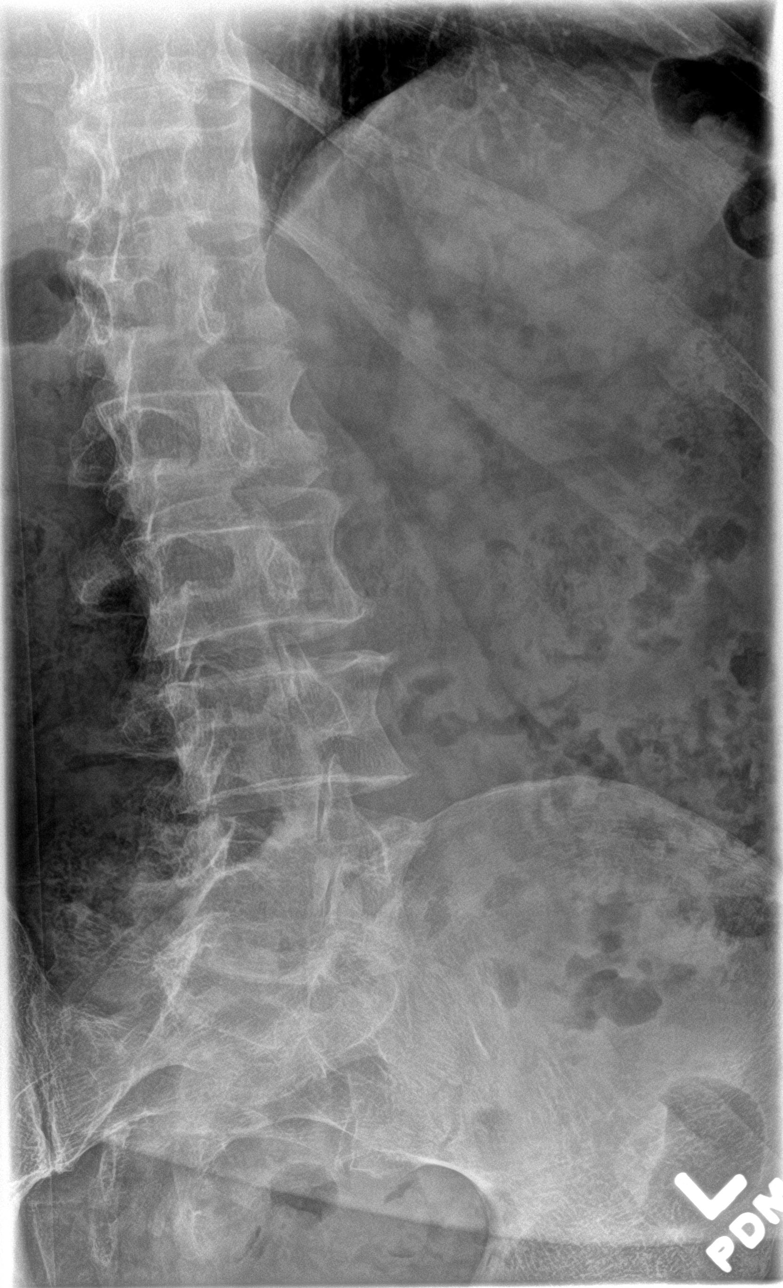

[l-spine lat]
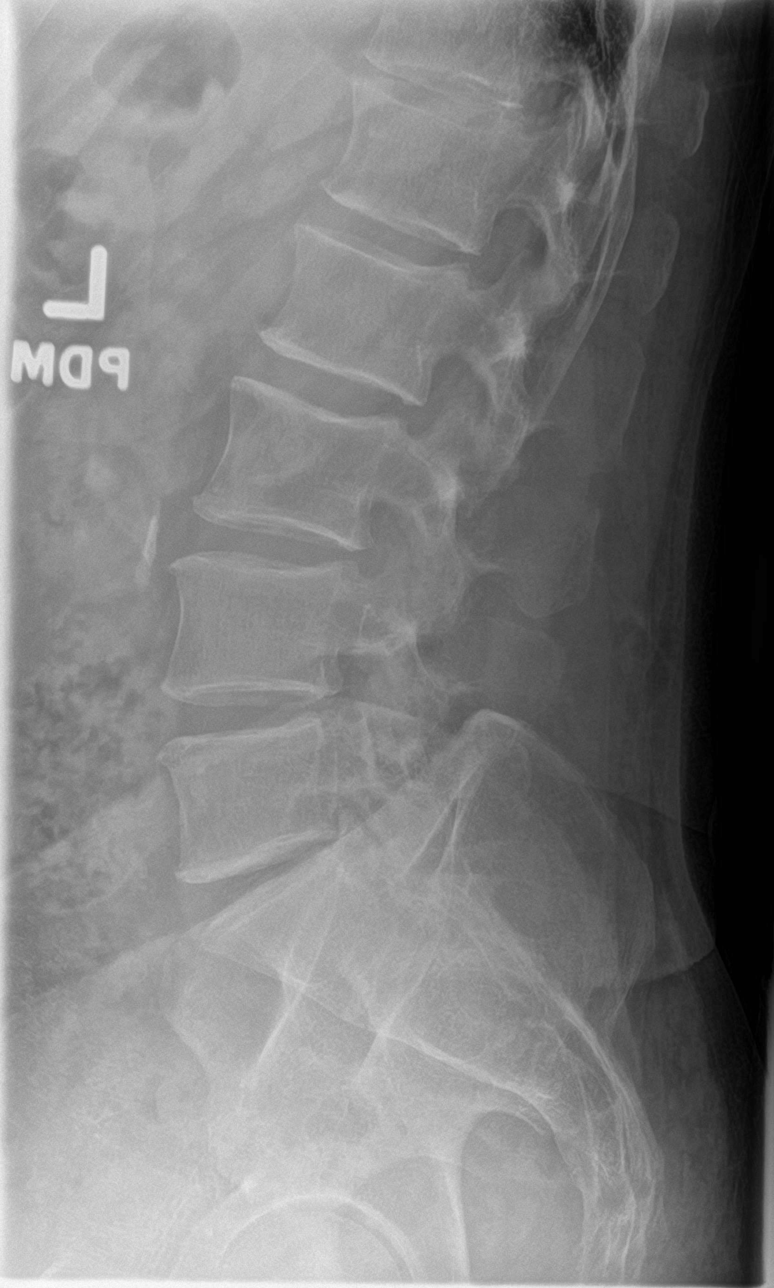

[l-spine spot]
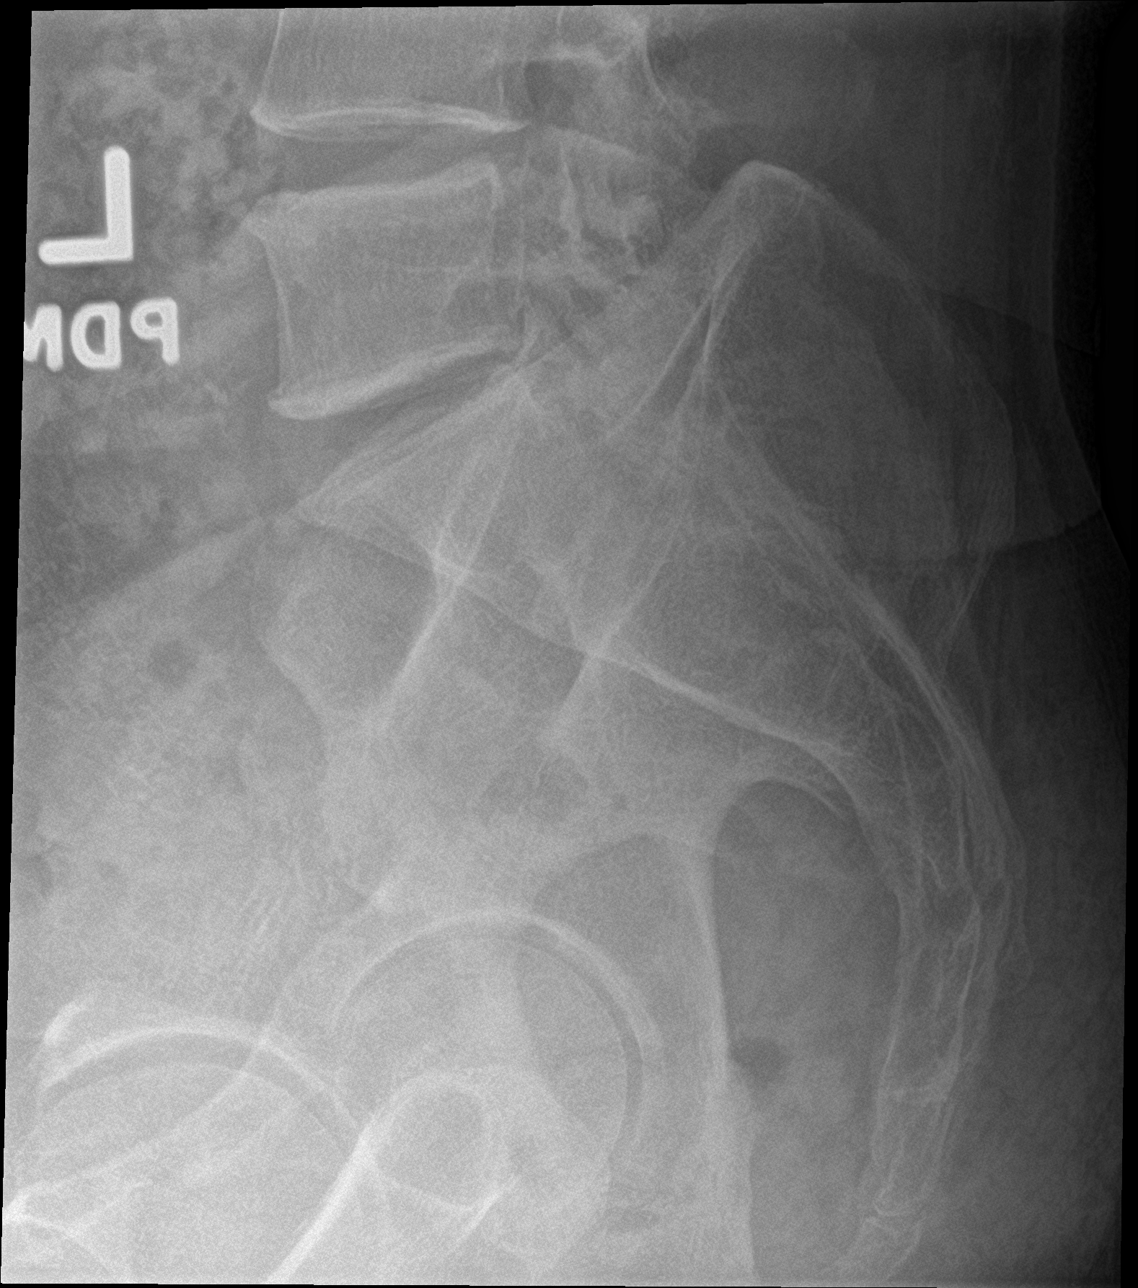

[5 of 5 positions shown; findings below may reference images not displayed]

FINDINGS: 5 non-rib-bearing lumbar vertebra.

Bones appear mildly demineralized.

Mild facet degenerative changes lower lumbar spine.

Vertebral body and disc space heights maintained.

No fracture, subluxation, or bone destruction.

No spondylolysis.

Atherosclerotic calcifications aorta.
IMPRESSION: Facet degenerative changes lower lumbar spine.

No acute abnormalities.

## 2019-06-19 ENCOUNTER — Other Ambulatory Visit: Payer: Self-pay | Admitting: Family Medicine

## 2019-07-05 ENCOUNTER — Ambulatory Visit: Payer: PRIVATE HEALTH INSURANCE | Attending: Internal Medicine

## 2019-07-05 DIAGNOSIS — Z23 Encounter for immunization: Secondary | ICD-10-CM

## 2019-07-05 NOTE — Progress Notes (Signed)
   Covid-19 Vaccination Clinic  Name:  CYLAR LAPA    MRN: [id number] DOB: 07-25-1955  07/05/2019  Mr. Goicoechea was observed post Covid-19 immunization for 15 minutes without incident. He was provided with Vaccine Information Sheet and instruction to access the V-Safe system.   Mr. Tiscareno was instructed to call 911 with any severe reactions post vaccine: Marland Kitchen Difficulty breathing  . Swelling of face and throat  . A fast heartbeat  . A bad rash all over body  . Dizziness and weakness   Immunizations Administered    Name Date Dose VIS Date Route   Pfizer COVID-19 Vaccine 07/05/2019  3:44 PM 0.3 mL 03/22/2019 Intramuscular   Manufacturer: [place]   Lot: [id number]   [place]: [id number]

## 2019-07-16 ENCOUNTER — Ambulatory Visit: Payer: PRIVATE HEALTH INSURANCE | Admitting: Family Medicine

## 2019-07-31 ENCOUNTER — Ambulatory Visit: Payer: PRIVATE HEALTH INSURANCE | Admitting: Family Medicine

## 2019-07-31 ENCOUNTER — Encounter: Payer: Self-pay | Admitting: Family Medicine

## 2019-07-31 ENCOUNTER — Ambulatory Visit: Payer: PRIVATE HEALTH INSURANCE | Attending: Internal Medicine

## 2019-07-31 ENCOUNTER — Other Ambulatory Visit: Payer: Self-pay

## 2019-07-31 VITALS — BP 130/86 | HR 75 | Ht 72.0 in | Wt 202.0 lb

## 2019-07-31 DIAGNOSIS — M19011 Primary osteoarthritis, right shoulder: Secondary | ICD-10-CM

## 2019-07-31 DIAGNOSIS — Z23 Encounter for immunization: Secondary | ICD-10-CM

## 2019-07-31 DIAGNOSIS — M542 Cervicalgia: Secondary | ICD-10-CM | POA: Diagnosis not present

## 2019-07-31 DIAGNOSIS — M999 Biomechanical lesion, unspecified: Secondary | ICD-10-CM

## 2019-07-31 NOTE — Progress Notes (Signed)
Dwayne Hunt [name] Phone: [phone] Subjective:   [name], am serving as a scribe for Dr. Hulan [name]. This visit occurred during the SARS-CoV-2 public health emergency.  Safety protocols were in place, including screening questions prior to the visit, additional usage of staff PPE, and extensive cleaning of exam room while observing appropriate contact time as indicated for disinfecting solutions.   I'm seeing this patient by the request  of:  Dwayne Billet, MD  CC: Neck and shoulder pain follow-up  [id number]   05/16/2019 Do believe the patient's neck pain is likely contributing as well. Patient did not respond as well to osteopathic manipulation previously. Discussed posture and ergonomics, follow-up again in 4 to 6 weeks  Continues to have some mild discomfort and pain. Patient has made some progress. I would like patient to continue conservative therapy for another 2 months. If continues to have worsening symptoms we will need to consider the possibility of advanced imaging  Patient seems to be doing somewhat better with the shoulder. 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 Patient still has radicular symptoms but it is more consistent with patient's neck. Discussed icing regimen and home exercises. Patient will increase activity as tolerated. Patient encouraged to take the gabapentin on a more regular basis. Follow-up again in 2 months  Update 07/31/2019 Dwayne Hunt is a 64 y.o. male coming in with complaint of right shoulder and neck pain. Patient states that he is no longer feeling a catching sensation. Has pain over superior aspect. tingling has not been as frequent as last visit. Pain in shoulder is worse in the morning after sleeping on it. Pain goes away after he moves it around.  Patient states having been taking anything for the pain.  Still  seems to be worse in the morning if anything.  Is also having a flare of his lateral epicondylitis, right arm after cutting down some trees.       Past Medical History:  Diagnosis Date  . GERD (gastroesophageal reflux disease)   . Gout   . Hypertension   . Melanoma ([place]) 2016   left shoulder   Past Surgical History:  Procedure Laterality Date  . BIOPSY SHOULDER Right 02/10/2016   invasive melanoma  . COLONOSCOPY  2012  . COLONOSCOPY WITH PROPOFOL N/A 02/16/2017   Procedure: COLONOSCOPY WITH PROPOFOL;  Surgeon: Dwayne Lame, MD;  Location: [facility];  Service: Endoscopy;  Laterality: N/A;  . ESOPHAGOGASTRODUODENOSCOPY (EGD) WITH PROPOFOL N/A 02/16/2017   Procedure: ESOPHAGOGASTRODUODENOSCOPY (EGD) WITH PROPOFOL;  Surgeon: Dwayne Lame, MD;  Location: [facility];  Service: Endoscopy;  Laterality: N/A;  . MELANOMA EXCISION Left 2016   Dr Dwayne Hunt  . POLYPECTOMY  02/16/2017   Procedure: POLYPECTOMY;  Surgeon: Dwayne Lame, MD;  Location: [facility];  Service: Endoscopy;;  . UPPER GI ENDOSCOPY  2012   Social History   Socioeconomic History  . Marital status: Divorced    Spouse name: Not on file  . Number of children: Not on file  . Years of education: Not on file  . Highest education level: Not on file  Occupational History  . Not on file  Tobacco Use  . Smoking status: Former Smoker    Years: 10.00    Types: Cigarettes    Quit date: 04/11/2005    Years since quitting: 14.3  . Smokeless tobacco: Never Used  Substance and Sexual Activity  . Alcohol use: Yes    Alcohol/week: 14.0 standard drinks    Types: 14 Shots of liquor per week    Comment: 2/day  . Drug use: No  . Sexual activity: Not on file  Other Topics Concern  . Not on file  Social History Narrative  . Not on file   Social Determinants of Health   Financial Resource Strain:   . Difficulty of Paying Living Expenses:   Food Insecurity:   . Worried About Charity fundraiser  in the Last Year:   . Arboriculturist in the Last Year:   Transportation Needs:   . Film/video editor (Medical):   Dwayne Hunt Lack of Transportation (Non-Medical):   Physical Activity:   . Days of Exercise per Week:   . Minutes of Exercise per Session:   Stress:   . Feeling of Stress :   Social Connections:   . Frequency of Communication with Friends and Family:   . Frequency of Social Gatherings with Friends and Family:   . Attends Religious Services:   . Active Member of Clubs or Organizations:   . Attends Archivist Meetings:   Dwayne Hunt Marital Status:    Allergies  Allergen Reactions  . Amoxicillin Rash   Family History  Problem Relation Age of Onset  . Ovarian cancer Mother   . Leukemia Father     Current Outpatient Medications (Endocrine & Metabolic):  Dwayne Hunt  Testosterone Cypionate 100 MG/ML SOLN, Inject as directed.  Current Outpatient Medications (Cardiovascular):  .  lisinopril (PRINIVIL,ZESTRIL) 20 MG tablet, Take 20 mg by mouth daily. .  tadalafil (CIALIS) 20 MG tablet,   Current Outpatient Medications (Respiratory):  .  fluticasone (FLONASE) 50 MCG/ACT nasal spray, Place 2 sprays into both nostrils daily.  Current Outpatient Medications (Analgesics):  .  allopurinol (ZYLOPRIM) 300 MG tablet, Take 300 mg by mouth daily. .  meloxicam (MOBIC) 15 MG tablet, TAKE 1 TABLET BY MOUTH EVERY DAY   Current Outpatient Medications (Other):  Dwayne Hunt  ALPRAZolam (XANAX) 1 MG tablet, Take 0.5 mg by mouth at bedtime as needed for anxiety. .  Diclofenac Sodium (PENNSAID) 2 % SOLN, Place 2 application 2 (two) times daily onto the skin. .  Vitamin D, Ergocalciferol, (DRISDOL) 1.25 MG (50000 UT) CAPS capsule, Take 1 capsule (50,000 Units total) by mouth every 7 (seven) days.   Reviewed prior external information including notes and imaging from  primary care provider As well as notes that were available from care everywhere and other healthcare systems.  Past medical history, social,  surgical and family history all reviewed in electronic medical record.  No pertanent information unless stated regarding to the chief complaint.   Review of Systems:  No headache, visual changes, nausea, vomiting, diarrhea, constipation, dizziness, abdominal pain, skin rash, fevers, chills, night sweats, weight loss, swollen lymph nodes, body aches, joint swelling, chest pain, shortness of breath, mood changes. POSITIVE muscle aches  Objective  Blood pressure 130/86, pulse 75, height 6' (1.829 m), weight 202 lb (91.6 kg), SpO2 98 %.   General: No apparent distress alert and oriented x3 mood and affect normal, dressed appropriately.  HEENT: Pupils equal, extraocular movements intact  Respiratory: Patient's speak in full sentences and does not appear short of breath  Cardiovascular: No lower extremity edema, non tender, no erythema  Neuro: Cranial nerves II through XII are intact, neurovascularly intact in all extremities with 2+ DTRs and 2+ pulses.  Gait normal with good  balance and coordination.  MSK: Right shoulder exam still has some positive impingement noted.  Patient does have positive crossover as well.  Neck exam does have significant loss of lordosis.  Minimal sidebending bilaterally.  Patient does have crepitus with range of motion.  Tightness noted in the parascapular region bilaterally but no spinous process tenderness.  5 out of 5 strength of the upper extremities.  Osteopathic findings C6 flexed rotated and side bent left T3 extended rotated and side bent right inhaled third rib    Impression and Recommendations:     This case required medical decision making of moderate complexity. The above documentation has been reviewed and is accurate and complete Lyndal Pulley, DO       Note: This dictation was prepared with Dragon dictation along with smaller phrase technology. Any transcriptional errors that result from this process are unintentional.

## 2019-07-31 NOTE — Patient Instructions (Signed)
Overall not bad See me in 6-8 weeks

## 2019-07-31 NOTE — Assessment & Plan Note (Signed)
Chronic, arthritic changes with history of gout as well.  Discussed medication management including meloxicam and allopurinol.  Follow-up again in 6 to 8 weeks

## 2019-07-31 NOTE — Assessment & Plan Note (Signed)
Stable, will montior may reepeat injection or shoulder injection for diagnostic and therapuetic purposes.

## 2019-07-31 NOTE — Progress Notes (Signed)
   Covid-19 Vaccination Clinic  Name:  Dwayne Hunt    MRN: [id number] DOB: Oct 07, 1955  07/31/2019  [name] was observed post Covid-19 immunization for 15 minutes without incident. He was provided with Vaccine Information Sheet and instruction to access the V-Safe system.   [name] was instructed to call 911 with any severe reactions post vaccine: Marland Kitchen Difficulty breathing  . Swelling of face and throat  . A fast heartbeat  . A bad rash all over body  . Dizziness and weakness   Immunizations Administered    Name Date Dose VIS Date Route   Pfizer COVID-19 Vaccine 07/31/2019  2:10 PM 0.3 mL 06/05/2018 Intramuscular   Manufacturer: [place]   Lot: [id number]   [place]: [id number]

## 2019-07-31 NOTE — Assessment & Plan Note (Signed)
   Decision today to treat with OMT was based on Physical Exam  After verbal consent patient was treated with HVLA, ME, FPR techniques in cervical, thoracic, rib, areas, all areas are chronic   Patient tolerated the procedure well with improvement in symptoms  Patient given exercises, stretches and lifestyle modifications  See medications in patient instructions if given  Patient will follow up in 6-8 weeks 

## 2019-08-08 ENCOUNTER — Other Ambulatory Visit: Payer: Self-pay | Admitting: Family Medicine

## 2019-09-12 ENCOUNTER — Ambulatory Visit: Payer: PRIVATE HEALTH INSURANCE | Admitting: Family Medicine

## 2019-10-02 ENCOUNTER — Other Ambulatory Visit: Payer: Self-pay

## 2019-10-02 ENCOUNTER — Encounter: Payer: Self-pay | Admitting: Family Medicine

## 2019-10-02 ENCOUNTER — Ambulatory Visit: Payer: Self-pay

## 2019-10-02 ENCOUNTER — Ambulatory Visit (INDEPENDENT_AMBULATORY_CARE_PROVIDER_SITE_OTHER): Payer: PRIVATE HEALTH INSURANCE | Admitting: Family Medicine

## 2019-10-02 VITALS — BP 122/90 | HR 71 | Ht 72.0 in | Wt 201.0 lb

## 2019-10-02 DIAGNOSIS — M25511 Pain in right shoulder: Secondary | ICD-10-CM | POA: Diagnosis not present

## 2019-10-02 DIAGNOSIS — G8929 Other chronic pain: Secondary | ICD-10-CM | POA: Diagnosis not present

## 2019-10-02 DIAGNOSIS — M999 Biomechanical lesion, unspecified: Secondary | ICD-10-CM

## 2019-10-02 DIAGNOSIS — M7551 Bursitis of right shoulder: Secondary | ICD-10-CM

## 2019-10-02 NOTE — Progress Notes (Signed)
[facility] [address] [place] Phone: [phone] Subjective:   I Dwayne Hunt am serving as a Education administrator for Dr. Hulan [name].  This visit occurred during the SARS-CoV-2 public health emergency.  Safety protocols were in place, including screening questions prior to the visit, additional usage of staff PPE, and extensive cleaning of exam room while observing appropriate contact time as indicated for disinfecting solutions.   I'm seeing this patient by the request  of:  [name], MD  CC: Shoulder pain follow-up, neck pain follow-up  [id number]  Dwayne Hunt is a 64 y.o. male coming in with complaint of back and neck pain. OMT 07/31/2019. Patient states he is here for his shoulder. Shoulder is still painful. Achy all the time patient states that his worse pain is driving for long distances. Anterior shoulder pain.   Medications patient has been prescribed: Topical anti-inflammatories and meloxicam intermittently  Taking:         Reviewed prior external information including notes and imaging from previsou exam, outside providers and external EMR if available.   As well as notes that were available from care everywhere and other healthcare systems.  Past medical history, social, surgical and family history all reviewed in electronic medical record.  No pertanent information unless stated regarding to the chief complaint.   Past Medical History:  Diagnosis Date  . GERD (gastroesophageal reflux disease)   . Gout   . Hypertension   . Melanoma ([place]) 2016   left shoulder    Allergies  Allergen Reactions  . Amoxicillin Rash     Review of Systems:  No headache, visual changes, nausea, vomiting, diarrhea, constipation, dizziness, abdominal pain, skin rash, fevers, chills, night sweats, weight loss, swollen lymph nodes, body aches, joint swelling, chest pain, shortness of breath, mood changes. POSITIVE muscle  aches  Objective  Blood pressure 122/90, pulse 71, height 6' (1.829 m), weight 201 lb (91.2 kg), SpO2 97 %.   General: No apparent distress alert and oriented x3 mood and affect normal, dressed appropriately.  HEENT: Pupils equal, extraocular movements intact  Respiratory: Patient's speak in full sentences and does not appear short of breath  Cardiovascular: No lower extremity edema, non tender, no erythema  Neuro: Cranial nerves II through XII are intact, neurovascularly intact in all extremities with 2+ DTRs and 2+ pulses.  Gait normal with good balance and coordination.  MSK: Right shoulder exam shows the patient does have some mild impingement still remaining.  Patient does have 5-5 strength of the rotator cuff.  Neurovascular intact distally. Back -tenderness noted in the parascapular region right greater than left.  Osteopathic findings  C2 flexed rotated and side bent right T3 extended rotated and side bent right inhaled rib T9 extended rotated and side bent left L2 flexed rotated and side bent right Sacrum right on right   Limited musculoskeletal ultrasound was performed and interpreted by Dwayne Hunt  Limited ultrasound of patient's right shoulder shows no significant reaccumulation at this moment.  Impression: Improvement from previous exam    Assessment and Plan:   Subacromial bursitis of right shoulder joint Ultrasound today shows that there is no significant increase in swelling noted at the moment.  Discussed icing regimen of home exercise.  Patient responding pretty well to manipulation.  Discussed posture being the main concern.  Follow-up again in 4 to 8 weeks    Nonallopathic problems  Decision today to treat with OMT was based on Physical  Exam  After verbal consent patient was treated with HVLA, ME, FPR techniques in cervical, rib, thoracic, areas  Patient tolerated the procedure well with improvement in symptoms  Patient given exercises, stretches  and lifestyle modifications  See medications in patient instructions if given  Patient will follow up in 4-8 weeks      The above documentation has been reviewed and is accurate and complete Dwayne Pulley, DO       Note: This dictation was prepared with Dragon dictation along with smaller phrase technology. Any transcriptional errors that result from this process are unintentional.

## 2019-10-02 NOTE — Assessment & Plan Note (Signed)
Ultrasound today shows that there is no significant increase in swelling noted at the moment.  Discussed icing regimen of home exercise.  Patient responding pretty well to manipulation.  Discussed posture being the main concern.  Follow-up again in 4 to 8 weeks

## 2019-10-02 NOTE — Patient Instructions (Addendum)
Good to see you Keep working on posture Yoga wheel  Good luck with golf See me again in 2 months

## 2019-12-02 ENCOUNTER — Ambulatory Visit: Payer: PRIVATE HEALTH INSURANCE | Admitting: Family Medicine

## 2019-12-02 ENCOUNTER — Ambulatory Visit: Payer: Self-pay

## 2019-12-02 ENCOUNTER — Other Ambulatory Visit: Payer: Self-pay

## 2019-12-02 ENCOUNTER — Encounter: Payer: Self-pay | Admitting: Family Medicine

## 2019-12-02 VITALS — BP 128/82 | HR 71 | Ht 72.0 in | Wt 201.0 lb

## 2019-12-02 DIAGNOSIS — M7551 Bursitis of right shoulder: Secondary | ICD-10-CM

## 2019-12-02 DIAGNOSIS — M25511 Pain in right shoulder: Secondary | ICD-10-CM | POA: Diagnosis not present

## 2019-12-02 DIAGNOSIS — G8929 Other chronic pain: Secondary | ICD-10-CM

## 2019-12-02 NOTE — Progress Notes (Signed)
[facility] [place] Phone: [phone] Subjective:   Dwayne Hunt, am serving as a scribe for Dr. Hulan [name]. This visit occurred during the SARS-CoV-2 public health emergency.  Safety protocols were in place, including screening questions prior to the visit, additional usage of staff PPE, and extensive cleaning of exam room while observing appropriate contact time as indicated for disinfecting solutions.   I'm seeing this patient by the request  of:  Dwayne Billet, MD  CC: Right shoulder pain follow-up  [id number]  Dwayne Hunt is a 64 y.o. male coming in with complaint of right shoulder and back pain. Last seen for OMT 10/02/2019. States that his pain is intermittent. Pain is worse with use and can cause tingling in right hand. Pain over superior aspect.  Patient has had this pain for quite some time.  Patient did not respond extremely well to the injections previously.  Patient's ultrasound certainly showed the acromioclavicular arthritis as well as the potential for gout and some subacromial bursitis.  Patient denies weakness but states that it is not improving anything such as golf and bowling seems to give him some trouble and he is concerned that he may worsen the pain as well as the long-term ramifications of this.     Past Medical History:  Diagnosis Date  . GERD (gastroesophageal reflux disease)   . Gout   . Hypertension   . Melanoma ([place]) 2016   left shoulder   Past Surgical History:  Procedure Laterality Date  . BIOPSY SHOULDER Right 02/10/2016   invasive melanoma  . COLONOSCOPY  2012  . COLONOSCOPY WITH PROPOFOL N/A 02/16/2017   Procedure: COLONOSCOPY WITH PROPOFOL;  Surgeon: Lucilla Lame, MD;  Location: [facility];  Service: Endoscopy;  Laterality: N/A;  . ESOPHAGOGASTRODUODENOSCOPY (EGD) WITH PROPOFOL N/A 02/16/2017   Procedure: ESOPHAGOGASTRODUODENOSCOPY (EGD) WITH PROPOFOL;  Surgeon: Lucilla Lame, MD;  Location: [facility];  Service: Endoscopy;  Laterality: N/A;  . MELANOMA EXCISION Left 2016   Dr Aubery Lapping  . POLYPECTOMY  02/16/2017   Procedure: POLYPECTOMY;  Surgeon: Lucilla Lame, MD;  Location: [facility];  Service: Endoscopy;;  . UPPER GI ENDOSCOPY  2012   Social History   Socioeconomic History  . Marital status: Divorced    Spouse name: Not on file  . Number of children: Not on file  . Years of education: Not on file  . Highest education level: Not on file  Occupational History  . Not on file  Tobacco Use  . Smoking status: Former Smoker    Years: 10.00    Types: Cigarettes    Quit date: 04/11/2005    Years since quitting: 14.6  . Smokeless tobacco: Never Used  Vaping Use  . Vaping Use: Never used  Substance and Sexual Activity  . Alcohol use: Yes    Alcohol/week: 14.0 standard drinks    Types: 14 Shots of liquor per week    Comment: 2/day  . Drug use: Hunt  . Sexual activity: Not on file  Other Topics Concern  . Not on file  Social History Narrative  . Not on file   Social Determinants of Health   Financial Resource Strain:   . Difficulty of Paying Living Expenses: Not on file  Food Insecurity:   . Worried About Charity fundraiser in the Last Year: Not on file  . Ran Out of Food in the Last Year: Not on file  Transportation  Needs:   . Lack of Transportation (Medical): Not on file  . Lack of Transportation (Non-Medical): Not on file  Physical Activity:   . Days of Exercise per Week: Not on file  . Minutes of Exercise per Session: Not on file  Stress:   . Feeling of Stress : Not on file  Social Connections:   . Frequency of Communication with Friends and Family: Not on file  . Frequency of Social Gatherings with Friends and Family: Not on file  . Attends Religious Services: Not on file  . Active Member of Clubs or Organizations: Not on file  . Attends Archivist Meetings: Not on file  . Marital Status: Not on  file   Allergies  Allergen Reactions  . Amoxicillin Rash   Family History  Problem Relation Age of Onset  . Ovarian cancer Mother   . Leukemia Father     Current Outpatient Medications (Endocrine & Metabolic):  Marland Kitchen  Testosterone Cypionate 100 MG/ML SOLN, Inject as directed.  Current Outpatient Medications (Cardiovascular):  .  lisinopril (PRINIVIL,ZESTRIL) 20 MG tablet, Take 20 mg by mouth daily. .  tadalafil (CIALIS) 20 MG tablet,   Current Outpatient Medications (Respiratory):  .  fluticasone (FLONASE) 50 MCG/ACT nasal spray, SPRAY 2 SPRAYS INTO EACH NOSTRIL EVERY DAY  Current Outpatient Medications (Analgesics):  .  allopurinol (ZYLOPRIM) 300 MG tablet, Take 300 mg by mouth daily. .  meloxicam (MOBIC) 15 MG tablet, TAKE 1 TABLET BY MOUTH EVERY DAY   Current Outpatient Medications (Other):  Marland Kitchen  ALPRAZolam (XANAX) 1 MG tablet, Take 0.5 mg by mouth at bedtime as needed for anxiety. .  Diclofenac Sodium (PENNSAID) 2 % SOLN, Place 2 application 2 (two) times daily onto the skin. .  Vitamin D, Ergocalciferol, (DRISDOL) 1.25 MG (50000 UT) CAPS capsule, Take 1 capsule (50,000 Units total) by mouth every 7 (seven) days.   Reviewed prior external information including notes and imaging from  primary care provider As well as notes that were available from care everywhere and other healthcare systems.  Past medical history, social, surgical and family history all reviewed in electronic medical record.  Hunt pertanent information unless stated regarding to the chief complaint.   Review of Systems:  Hunt headache, visual changes, nausea, vomiting, diarrhea, constipation, dizziness, abdominal pain, skin rash, fevers, chills, night sweats, weight loss, swollen lymph nodes, body aches, joint swelling, chest pain, shortness of breath, mood changes. POSITIVE muscle aches  Objective  Blood pressure 128/82, pulse 71, height 6' (1.829 m), weight 201 lb (91.2 kg), SpO2 98 %.   General: Hunt apparent  distress alert and oriented x3 mood and affect normal, dressed appropriately.  HEENT: Pupils equal, extraocular movements intact  Respiratory: Patient's speak in full sentences and does not appear short of breath  Cardiovascular: Hunt lower extremity edema, non tender, Hunt erythema  Neuro: Cranial nerves II through XII are intact, neurovascularly intact in all extremities with 2+ DTRs and 2+ pulses.  MSK: Right shoulder shows a positive impingement as well as positive O'Brien's.  Patient does have a negative empty can now.  Mild positive Hawkins and Neer's.  Mild positive crossover    Impression and Recommendations:     The above documentation has been reviewed and is accurate and complete Lyndal Pulley, DO       Note: This dictation was prepared with Dragon dictation along with smaller phrase technology. Any transcriptional errors that result from this process are unintentional.

## 2019-12-02 NOTE — Patient Instructions (Signed)
Good to see you  You have done great but lets make sure MR- arthrogram of shoulder- right  Call [phone] to speed up the process.  Take a full xanax before the scan  I will write you when we get the results and discuss next step

## 2019-12-02 NOTE — Assessment & Plan Note (Signed)
Patient continues to have difficulty with the shoulder at this time and we could be missing potentially a labral pathology with the ultrasound.  Patient did respond fairly well to the injections previously but at the moment patient is having more pain with even regular activities such as bowling and golf.  Patient will have an MR arthrogram to rule out the labral pathology especially with a positive O'Brien sign noted today.  Follow-up with me again in 4 to 8 weeks.

## 2019-12-23 ENCOUNTER — Inpatient Hospital Stay: Admission: RE | Admit: 2019-12-23 | Payer: PRIVATE HEALTH INSURANCE | Source: Ambulatory Visit

## 2019-12-23 ENCOUNTER — Other Ambulatory Visit: Payer: PRIVATE HEALTH INSURANCE

## 2019-12-30 ENCOUNTER — Other Ambulatory Visit: Payer: Self-pay

## 2019-12-30 ENCOUNTER — Ambulatory Visit
Admission: RE | Admit: 2019-12-30 | Discharge: 2019-12-30 | Disposition: A | Discharge status: Home / Self Care | Payer: PRIVATE HEALTH INSURANCE | Source: Ambulatory Visit | Attending: Family Medicine | Admitting: Family Medicine

## 2019-12-30 DIAGNOSIS — G8929 Other chronic pain: Secondary | ICD-10-CM

## 2019-12-30 DIAGNOSIS — M7551 Bursitis of right shoulder: Secondary | ICD-10-CM

## 2019-12-30 MED ORDER — IOPAMIDOL (ISOVUE-M 200) INJECTION 41%
12.0000 mL | Freq: Once | INTRAMUSCULAR | Status: AC
  Administered 2019-12-30: 12 mL via INTRA_ARTICULAR

## 2019-12-31 ENCOUNTER — Encounter: Payer: Self-pay | Admitting: Family Medicine

## 2020-01-01 ENCOUNTER — Other Ambulatory Visit: Payer: Self-pay

## 2020-01-01 DIAGNOSIS — M7551 Bursitis of right shoulder: Secondary | ICD-10-CM

## 2020-01-01 DIAGNOSIS — M19011 Primary osteoarthritis, right shoulder: Secondary | ICD-10-CM

## 2020-01-27 ENCOUNTER — Other Ambulatory Visit: Payer: Self-pay

## 2020-01-27 ENCOUNTER — Encounter: Payer: Self-pay | Admitting: Gastroenterology

## 2020-01-27 ENCOUNTER — Ambulatory Visit: Payer: PRIVATE HEALTH INSURANCE | Admitting: Gastroenterology

## 2020-01-27 DIAGNOSIS — K559 Vascular disorder of intestine, unspecified: Secondary | ICD-10-CM | POA: Diagnosis not present

## 2020-01-27 NOTE — Progress Notes (Signed)
Primary Care Physician: [name], MD  Primary Gastroenterologist:  Dr. Lucilla Lame  No chief complaint on file.   HPI: Dwayne Hunt is a 64 y.o. male here for blood in stool.  The patient had a colonoscopy by me in 2018 with the findings of multiple polyps.  Some of the polyps were adenomas.  At that time the patient also had an upper endoscopy with benign gastric polyps found. The patient reports that approximately 2 weeks ago he started to have abdominal pain.  The abdominal pain was followed by rectal bleeding.  He reports intermittent cramps that continued with the bleeding and some loose bowel movements.  The patient then states that he has been fine for the last couple of days.  There is no report of any nausea vomiting fevers or chills.  He does state that he was diagnosed in the past with diverticulitis and thought that he may have another attack of that but the pain subsided.  Past Medical History:  Diagnosis Date  . GERD (gastroesophageal reflux disease)   . Gout   . Hypertension   . Melanoma ([place]) 2016   left shoulder    Current Outpatient Medications  Medication Sig Dispense Refill  . allopurinol (ZYLOPRIM) 300 MG tablet Take 300 mg by mouth daily.  12  . ALPRAZolam (XANAX) 1 MG tablet Take 0.5 mg by mouth at bedtime as needed for anxiety.    . clobetasol cream (TEMOVATE) 1.61 % Apply 1 application topically 2 (two) times daily.    Marland Kitchen DEXILANT 60 MG capsule Take 1 capsule by mouth daily.    . Diclofenac Sodium (PENNSAID) 2 % SOLN Place 2 application 2 (two) times daily onto the skin. 112 g 3  . fluticasone (FLONASE) 50 MCG/ACT nasal spray SPRAY 2 SPRAYS INTO EACH NOSTRIL EVERY DAY 48 mL 2  . lisinopril (PRINIVIL,ZESTRIL) 20 MG tablet Take 20 mg by mouth daily.  4  . meloxicam (MOBIC) 15 MG tablet TAKE 1 TABLET BY MOUTH EVERY DAY 30 tablet 0  . tadalafil (CIALIS) 20 MG tablet     . Testosterone Cypionate 100 MG/ML SOLN Inject as directed.    . Vitamin D,  Ergocalciferol, (DRISDOL) 1.25 MG (50000 UT) CAPS capsule Take 1 capsule (50,000 Units total) by mouth every 7 (seven) days. 12 capsule 0   No current facility-administered medications for this visit.    Allergies as of 01/27/2020 - Review Complete 12/02/2019  Allergen Reaction Noted  . Amoxicillin Rash 11/17/2016    ROS:  General: Negative for anorexia, weight loss, fever, chills, fatigue, weakness. ENT: Negative for hoarseness, difficulty swallowing , nasal congestion. CV: Negative for chest pain, angina, palpitations, dyspnea on exertion, peripheral edema.  Respiratory: Negative for dyspnea at rest, dyspnea on exertion, cough, sputum, wheezing.  GI: See history of present illness. GU:  Negative for dysuria, hematuria, urinary incontinence, urinary frequency, nocturnal urination.  Endo: Negative for unusual weight change.    Physical Examination:   There were no vitals taken for this visit.  General: Well-nourished, well-developed in no acute distress.  Eyes: No icterus. Conjunctivae pink. Lungs: Clear to auscultation bilaterally. Non-labored. Heart: Regular rate and rhythm, no murmurs rubs or gallops.  Abdomen: Bowel sounds are normal, nontender, nondistended, no hepatosplenomegaly or masses, no abdominal bruits or hernia , no rebound or guarding.   Extremities: No lower extremity edema. No clubbing or deformities. Neuro: Alert and oriented x 3.  Grossly intact. Skin: Warm and dry, no jaundice.   Psych:  Alert and cooperative, normal mood and affect.  Labs:    Imaging Studies: MR Shoulder Right W Contrast  Result Date: 12/31/2019 CLINICAL DATA:  Right shoulder pain for 1 year EXAM: MR ARTHROGRAM OF THE RIGHT SHOULDER TECHNIQUE: Multiplanar, multisequence MR imaging of the right shoulder was performed following the administration of intra-articular contrast. CONTRAST:  See Injection Documentation. COMPARISON:  None. FINDINGS: Rotator cuff: Focal articular sided tear of the  anterior aspect of the distal supraspinatus tendon involving up to 50% of the tendon depth (series 6, images 10-11). Tiny near full-thickness perforation of the midportion of the supraspinatus tendon approximately 10 mm proximal to its insertion (series 6, image 9). Low-grade bursal sided fraying/irregularity of the distal supraspinatus tendon. The infraspinatus, subscapularis, and teres minor tendons appear intact. No full thickness rotator cuff tear. Muscles: Preserved bulk and signal intensity of the rotator cuff musculature without edema, atrophy, or fatty infiltration. Biceps long head: Mild intra-articular biceps tendinosis. Acromioclavicular Joint: Moderate arthropathy of the AC joint. Trace fluid within the subacromial-subdeltoid bursa. No contrast extends into the bursal space. Glenohumeral Joint: Glenohumeral joint is well distended with injected contrast. No cartilage defect. Labrum: Posterosuperior labrum is frayed/degenerated. No well-defined labral tear. No paralabral cyst. Bones: Degenerative marrow signal changes centered at the [place] joint. No acute fracture. No dislocation. No suspicious bone lesion. IMPRESSION: 1. Focal articular-sided tear of the distal supraspinatus tendon anteriorly involving up to 50% of the tendon depth. Tiny near full-thickness perforation of the midportion of the supraspinatus tendon. Low-grade bursal sided fraying/irregularity of the distal supraspinatus tendon. No full-thickness rotator cuff tear. 2. Mild intra-articular biceps tendinosis. 3. Posterosuperior labral degeneration/fraying. No well-defined labral tear. 4. Moderate arthropathy of the AC joint. Electronically Signed   By: Davina Poke D.O.   On: 12/31/2019 08:54   DG FLUORO GUIDED NEEDLE PLC ASPIRATION/INJECTION LOC  Result Date: 12/30/2019 CLINICAL DATA:  Pain.  No previous surgery EXAM: EXAM RIGHT SHOULDER INJECTION UNDER FLUOROSCOPY FOR MRI FLUOROSCOPY TIME:  15 seconds; 10 uGym2 DAP TECHNIQUE: The  procedure, risks (including but not limited to bleeding, infection, organ damage ), benefits, and alternatives were explained to the patient. Questions regarding the procedure were encouraged and answered. The patient understands and consents to the procedure. An appropriate skin entry site was determined under fluoroscopy. Skin site was marked, prepped with Betadine, and draped in usual sterile fashion, and infiltrated locally with 1% lidocaine. 22-gauge spinal needle advanced to the superior medial margin of the humeral head. 1 mL of lidocaine 1% injected easily. 38ml of a mixture of 10 mL saline, and 58ml Omnipaque 180 with 0.63ml Multihance contrast was injected into the shoulder joint. Intraarticular flow was confirmed on fluoroscopy. Patient transferred to MRI. COMPLICATIONS: COMPLICATIONS none IMPRESSION: 1. Technically successful right shoulder injection for MRI Electronically Signed   By: Lucrezia Europe M.D.   On: 12/30/2019 16:02    Assessment and Plan:   Dwayne Hunt is a 64 y.o. y/o male who comes in today with what sounds like a episode of ischemic colitis with abdominal pain and rectal bleeding with some loose bowel movements.  The patient is not due for another colonoscopy until 2023.  The patient has been told that if his symptoms return that he should contact me for possible colonoscopy.  Otherwise he has been told to stay hydrated and watch for any continuation of his symptoms.  The patient has been explained the plan and agrees with it.     Lucilla Lame, MD. Marval Regal  Note: This dictation was prepared with Dragon dictation along with smaller phrase technology. Any transcriptional errors that result from this process are unintentional.

## 2020-04-21 ENCOUNTER — Other Ambulatory Visit: Payer: Self-pay

## 2020-04-21 ENCOUNTER — Ambulatory Visit
Admission: RE | Admit: 2020-04-21 | Discharge: 2020-04-21 | Disposition: A | Discharge status: Home / Self Care | Payer: 59 | Attending: Internal Medicine | Admitting: Internal Medicine

## 2020-04-21 ENCOUNTER — Ambulatory Visit
Admission: RE | Admit: 2020-04-21 | Discharge: 2020-04-21 | Disposition: A | Discharge status: Home / Self Care | Payer: 59 | Source: Ambulatory Visit | Attending: Internal Medicine | Admitting: Internal Medicine

## 2020-04-21 ENCOUNTER — Other Ambulatory Visit: Payer: Self-pay | Admitting: Internal Medicine

## 2020-04-21 DIAGNOSIS — R059 Cough, unspecified: Secondary | ICD-10-CM

## 2020-05-06 ENCOUNTER — Ambulatory Visit
Admission: RE | Admit: 2020-05-06 | Discharge: 2020-05-06 | Disposition: A | Discharge status: Home / Self Care | Payer: 59 | Attending: Internal Medicine | Admitting: Internal Medicine

## 2020-05-06 ENCOUNTER — Ambulatory Visit
Admission: RE | Admit: 2020-05-06 | Discharge: 2020-05-06 | Disposition: A | Discharge status: Home / Self Care | Payer: 59 | Source: Ambulatory Visit | Attending: Internal Medicine | Admitting: Internal Medicine

## 2020-05-06 ENCOUNTER — Other Ambulatory Visit: Payer: Self-pay | Admitting: Internal Medicine

## 2020-05-06 ENCOUNTER — Other Ambulatory Visit: Payer: Self-pay

## 2020-05-06 DIAGNOSIS — R918 Other nonspecific abnormal finding of lung field: Secondary | ICD-10-CM | POA: Diagnosis present

## 2020-08-14 ENCOUNTER — Other Ambulatory Visit: Payer: Self-pay | Admitting: Family Medicine

## 2020-08-17 ENCOUNTER — Other Ambulatory Visit: Payer: Self-pay

## 2020-08-17 MED ORDER — FLUTICASONE PROPIONATE 50 MCG/ACT NA SUSP: NASAL | 0 refills | Status: AC

## 2020-10-30 ENCOUNTER — Other Ambulatory Visit (HOSPITAL_COMMUNITY): Payer: Self-pay

## 2020-10-30 ENCOUNTER — Other Ambulatory Visit: Payer: Self-pay | Admitting: Internal Medicine

## 2020-10-30 ENCOUNTER — Other Ambulatory Visit (HOSPITAL_COMMUNITY): Payer: Self-pay | Admitting: Internal Medicine

## 2020-10-30 DIAGNOSIS — R945 Abnormal results of liver function studies: Secondary | ICD-10-CM

## 2020-10-30 DIAGNOSIS — R7989 Other specified abnormal findings of blood chemistry: Secondary | ICD-10-CM

## 2020-11-06 ENCOUNTER — Ambulatory Visit
Admission: RE | Admit: 2020-11-06 | Discharge: 2020-11-06 | Disposition: A | Discharge status: Home / Self Care | Payer: Medicare HMO | Source: Ambulatory Visit | Attending: Internal Medicine | Admitting: Internal Medicine

## 2020-11-06 ENCOUNTER — Other Ambulatory Visit: Payer: Self-pay

## 2020-11-06 DIAGNOSIS — R945 Abnormal results of liver function studies: Secondary | ICD-10-CM | POA: Diagnosis not present

## 2020-11-06 DIAGNOSIS — R7989 Other specified abnormal findings of blood chemistry: Secondary | ICD-10-CM

## 2021-06-02 ENCOUNTER — Other Ambulatory Visit: Payer: Self-pay | Admitting: Orthopedic Surgery

## 2021-06-02 DIAGNOSIS — M5416 Radiculopathy, lumbar region: Secondary | ICD-10-CM

## 2021-06-13 ENCOUNTER — Other Ambulatory Visit: Payer: Self-pay

## 2021-06-13 ENCOUNTER — Ambulatory Visit
Admission: RE | Admit: 2021-06-13 | Discharge: 2021-06-13 | Disposition: A | Discharge status: Home / Self Care | Payer: Medicare HMO | Source: Ambulatory Visit | Attending: Orthopedic Surgery | Admitting: Orthopedic Surgery

## 2021-06-13 DIAGNOSIS — M5416 Radiculopathy, lumbar region: Secondary | ICD-10-CM

## 2022-02-16 ENCOUNTER — Telehealth: Payer: Self-pay | Admitting: *Deleted

## 2022-02-16 ENCOUNTER — Other Ambulatory Visit: Payer: Self-pay | Admitting: *Deleted

## 2022-02-16 DIAGNOSIS — K317 Polyp of stomach and duodenum: Secondary | ICD-10-CM

## 2022-02-16 DIAGNOSIS — Z86010 Personal history of colonic polyps: Secondary | ICD-10-CM

## 2022-02-16 MED ORDER — NA SULFATE-K SULFATE-MG SULF 17.5-3.13-1.6 GM/177ML PO SOLN: 1.0000 | Freq: Once | ORAL | 0 refills | Status: AC

## 2022-02-16 NOTE — Telephone Encounter (Signed)
Gastroenterology Pre-Procedure Review  Request Date: 03/10/2022 Requesting Physician: Dr. Allen Norris  PATIENT REVIEW QUESTIONS: The patient responded to the following health history questions as indicated:    1. Are you having any GI issues? no 2. Do you have a personal history of Polyps? yes (last colonoscopy 02/16/2017) 3. Do you have a family history of Colon Cancer or Polyps? no 4. Diabetes Mellitus? no 5. Joint replacements in the past 12 months?no 6. Major health problems in the past 3 months?no 7. Any artificial heart valves, MVP, or defibrillator?no    MEDICATIONS & ALLERGIES:    Patient reports the following regarding taking any anticoagulation/antiplatelet therapy:   Plavix, Coumadin, Eliquis, Xarelto, Lovenox, Pradaxa, Brilinta, or Effient? no Aspirin? no  Patient confirms/reports the following medications:  Current Outpatient Medications  Medication Sig Dispense Refill   Na Sulfate-K Sulfate-Mg Sulf 17.5-3.13-1.6 GM/177ML SOLN Take 1 kit by mouth once for 1 dose. 354 mL 0   allopurinol (ZYLOPRIM) 300 MG tablet Take 300 mg by mouth daily.  12   ALPRAZolam (XANAX) 1 MG tablet Take 0.5 mg by mouth at bedtime as needed for anxiety.     clobetasol cream (TEMOVATE) 8.11 % Apply 1 application topically 2 (two) times daily.     DEXILANT 60 MG capsule Take 1 capsule by mouth daily.     Diclofenac Sodium (PENNSAID) 2 % SOLN Place 2 application 2 (two) times daily onto the skin. 112 g 3   fluticasone (FLONASE) 50 MCG/ACT nasal spray SPRAY 2 SPRAYS INTO EACH NOSTRIL EVERY DAY 48 mL 0   lisinopril (PRINIVIL,ZESTRIL) 20 MG tablet Take 20 mg by mouth daily.  4   meloxicam (MOBIC) 15 MG tablet TAKE 1 TABLET BY MOUTH EVERY DAY 30 tablet 0   tadalafil (CIALIS) 20 MG tablet      Testosterone Cypionate 100 MG/ML SOLN Inject as directed.     Vitamin D, Ergocalciferol, (DRISDOL) 1.25 MG (50000 UT) CAPS capsule Take 1 capsule (50,000 Units total) by mouth every 7 (seven) days. 12 capsule 0   No  current facility-administered medications for this visit.    Patient confirms/reports the following allergies:  Allergies  Allergen Reactions   Amoxicillin Rash    No orders of the defined types were placed in this encounter.   AUTHORIZATION INFORMATION Primary Insurance: 1D#: Group #:  Secondary Insurance: 1D#: Group #:  SCHEDULE INFORMATION: Date:03/10/2022  Time: Location:[facility]

## 2022-02-16 NOTE — Telephone Encounter (Signed)
Patient called office to schedule his repeat colonoscopy. However, patient is requesting to get EGD repeat as well.  Okay to schedule both?

## 2022-02-16 NOTE — Telephone Encounter (Signed)
Notified patient that Dr Allen Norris gave the okay to perform both procedures.

## 2022-03-01 ENCOUNTER — Encounter: Payer: Self-pay | Admitting: Gastroenterology

## 2022-03-09 ENCOUNTER — Encounter: Payer: Self-pay | Admitting: Gastroenterology

## 2022-03-10 ENCOUNTER — Encounter
Admission: RE | Disposition: A | Discharge status: Home / Self Care | Payer: Self-pay | Source: Home / Self Care | Attending: Gastroenterology

## 2022-03-10 ENCOUNTER — Other Ambulatory Visit: Payer: Self-pay

## 2022-03-10 ENCOUNTER — Ambulatory Visit: Payer: Medicare HMO | Admitting: General Practice

## 2022-03-10 ENCOUNTER — Ambulatory Visit
Admission: RE | Admit: 2022-03-10 | Discharge: 2022-03-10 | Disposition: A | Discharge status: Home / Self Care | Payer: Medicare HMO | Attending: Gastroenterology | Admitting: Gastroenterology

## 2022-03-10 DIAGNOSIS — Z86010 Personal history of colonic polyps: Secondary | ICD-10-CM | POA: Diagnosis not present

## 2022-03-10 DIAGNOSIS — Z1211 Encounter for screening for malignant neoplasm of colon: Secondary | ICD-10-CM | POA: Insufficient documentation

## 2022-03-10 DIAGNOSIS — I10 Essential (primary) hypertension: Secondary | ICD-10-CM | POA: Diagnosis not present

## 2022-03-10 DIAGNOSIS — Z85820 Personal history of malignant melanoma of skin: Secondary | ICD-10-CM | POA: Diagnosis not present

## 2022-03-10 DIAGNOSIS — K317 Polyp of stomach and duodenum: Secondary | ICD-10-CM

## 2022-03-10 DIAGNOSIS — D123 Benign neoplasm of transverse colon: Secondary | ICD-10-CM | POA: Diagnosis not present

## 2022-03-10 DIAGNOSIS — D122 Benign neoplasm of ascending colon: Secondary | ICD-10-CM | POA: Diagnosis not present

## 2022-03-10 DIAGNOSIS — R12 Heartburn: Secondary | ICD-10-CM | POA: Diagnosis not present

## 2022-03-10 DIAGNOSIS — Z09 Encounter for follow-up examination after completed treatment for conditions other than malignant neoplasm: Secondary | ICD-10-CM | POA: Insufficient documentation

## 2022-03-10 DIAGNOSIS — K640 First degree hemorrhoids: Secondary | ICD-10-CM | POA: Insufficient documentation

## 2022-03-10 DIAGNOSIS — Z87891 Personal history of nicotine dependence: Secondary | ICD-10-CM | POA: Insufficient documentation

## 2022-03-10 DIAGNOSIS — K5730 Diverticulosis of large intestine without perforation or abscess without bleeding: Secondary | ICD-10-CM | POA: Insufficient documentation

## 2022-03-10 DIAGNOSIS — K635 Polyp of colon: Secondary | ICD-10-CM

## 2022-03-10 DIAGNOSIS — K219 Gastro-esophageal reflux disease without esophagitis: Secondary | ICD-10-CM | POA: Diagnosis not present

## 2022-03-10 HISTORY — PX: ESOPHAGOGASTRODUODENOSCOPY (EGD) WITH PROPOFOL: SHX5813

## 2022-03-10 HISTORY — PX: COLONOSCOPY WITH PROPOFOL: SHX5780

## 2022-03-10 SURGERY — COLONOSCOPY WITH PROPOFOL
Anesthesia: General

## 2022-03-10 MED ORDER — PROPOFOL 10 MG/ML IV BOLUS
INTRAVENOUS | Status: DC | PRN
  Administered 2022-03-10: 30 mg via INTRAVENOUS
  Administered 2022-03-10: 20 mg via INTRAVENOUS
  Administered 2022-03-10: 70 mg via INTRAVENOUS

## 2022-03-10 MED ORDER — SODIUM CHLORIDE 0.9 % IV SOLN: INTRAVENOUS | Status: DC

## 2022-03-10 MED ORDER — PROPOFOL 1000 MG/100ML IV EMUL
INTRAVENOUS | Status: AC
  Filled 2022-03-10: qty 100

## 2022-03-10 MED ORDER — LIDOCAINE HCL (PF) 2 % IJ SOLN
INTRAMUSCULAR | Status: AC
  Filled 2022-03-10: qty 5

## 2022-03-10 MED ORDER — GLYCOPYRROLATE 0.2 MG/ML IJ SOLN
INTRAMUSCULAR | Status: AC
  Filled 2022-03-10: qty 1

## 2022-03-10 MED ORDER — ALBUTEROL SULFATE HFA 108 (90 BASE) MCG/ACT IN AERS
INHALATION_SPRAY | RESPIRATORY_TRACT | Status: AC
  Filled 2022-03-10: qty 6.7

## 2022-03-10 MED ORDER — PROPOFOL 500 MG/50ML IV EMUL
INTRAVENOUS | Status: DC | PRN
  Administered 2022-03-10: 150 ug/kg/min via INTRAVENOUS

## 2022-03-10 MED ORDER — GLYCOPYRROLATE 0.2 MG/ML IJ SOLN
INTRAMUSCULAR | Status: DC | PRN
  Administered 2022-03-10: .2 mg via INTRAVENOUS

## 2022-03-10 NOTE — Op Note (Signed)
[facility] Gastroenterology Patient Name: Dwayne Hunt Procedure Date: 03/10/2022 12:34 PM MRN: [id number] Account #: [id number] Date of Birth: 12-23-1955 Admit Type: Outpatient Age: 66 Room: [facility] ENDO ROOM 3 Gender: Male Note Status: Finalized Instrument Name: Park Meo [id number] Procedure:             Colonoscopy Indications:           High risk colon cancer surveillance: Personal history                         of colonic polyps Providers:             Lucilla Lame MD, MD Referring MD:          Leona Carry. Hall Busing, MD (Referring MD) Medicines:             Propofol per Anesthesia Complications:         No immediate complications. Procedure:             Pre-Anesthesia Assessment:                        - Prior to the procedure, a History and Physical was                         performed, and patient medications and allergies were                         reviewed. The patient's tolerance of previous                         anesthesia was also reviewed. The risks and benefits                         of the procedure and the sedation options and risks                         were discussed with the patient. All questions were                         answered, and informed consent was obtained. Prior                         Anticoagulants: The patient has taken no anticoagulant                         or antiplatelet agents. ASA Grade Assessment: II - A                         patient with mild systemic disease. After reviewing                         the risks and benefits, the patient was deemed in                         satisfactory condition to undergo the procedure.                        After obtaining informed consent, the colonoscope was  passed under direct vision. Throughout the procedure,                         the patient's blood pressure, pulse, and oxygen                         saturations were monitored continuously. The                          Colonoscope was introduced through the anus and                         advanced to the the cecum, identified by appendiceal                         orifice and ileocecal valve. The colonoscopy was                         performed without difficulty. The patient tolerated                         the procedure well. The quality of the bowel                         preparation was excellent. Findings:      The perianal and digital rectal examinations were normal.      Four sessile polyps were found in the transverse colon and ascending       colon. The polyps were 3 to 4 mm in size. These polyps were removed with       a cold snare. Resection and retrieval were complete. These polyps were       removed with a cold biopsy forceps. Resection and retrieval were       complete.      Multiple small-mouthed diverticula were found in the sigmoid colon.      Non-bleeding internal hemorrhoids were found during retroflexion. The       hemorrhoids were Grade I (internal hemorrhoids that do not prolapse). Impression:            - Four 3 to 4 mm polyps in the transverse colon and in                         the ascending colon, removed with a cold snare and                         removed with a cold biopsy forceps. Resected and                         retrieved.                        - Diverticulosis in the sigmoid colon.                        - Non-bleeding internal hemorrhoids. Recommendation:        - Discharge patient to home.                        - Resume previous diet.                        -  Continue present medications.                        - Await pathology results.                        - Repeat colonoscopy in 4 years for surveillance. Procedure Code(s):     --- Professional ---                        [phone], Colonoscopy, flexible; with removal of                         tumor(s), polyp(s), or other lesion(s) by snare                         technique Diagnosis Code(s):      --- Professional ---                        Z86.010, Personal history of colonic polyps                        D12.3, Benign neoplasm of transverse colon (hepatic                         flexure or splenic flexure) CPT copyright 2022 American Medical Association. All rights reserved. The codes documented in this report are preliminary and upon coder review may  be revised to meet current compliance requirements. Lucilla Lame MD, MD 03/10/2022 1:17:05 PM This report has been signed electronically. Number of Addenda: 0 Note Initiated On: 03/10/2022 12:34 PM Scope Withdrawal Time: 0 hours 6 minutes 12 seconds  Total Procedure Duration: 0 hours 13 minutes 30 seconds  Estimated Blood Loss:  Estimated blood loss: none.      [facility]

## 2022-03-10 NOTE — Anesthesia Postprocedure Evaluation (Signed)
Anesthesia Post Note  Patient: Dwayne Hunt  Procedure(s) Performed: COLONOSCOPY WITH PROPOFOL ESOPHAGOGASTRODUODENOSCOPY (EGD) WITH PROPOFOL  Patient location during evaluation: Endoscopy Anesthesia Type: General Level of consciousness: awake and alert Pain management: pain level controlled Vital Signs Assessment: post-procedure vital signs reviewed and stable Respiratory status: spontaneous breathing, nonlabored ventilation, respiratory function stable and patient connected to nasal cannula oxygen Cardiovascular status: blood pressure returned to baseline and stable Postop Assessment: no apparent nausea or vomiting Anesthetic complications: no   No notable events documented.   Last Vitals:  Vitals:   03/10/22 1328 03/10/22 1338  BP: 114/84 (!) 117/55  Pulse: 64   Resp: 14 20  Temp:    SpO2: 96% 97%    Last Pain:  Vitals:   03/10/22 1338  TempSrc:   PainSc: 0-No pain                 Arita Miss

## 2022-03-10 NOTE — Anesthesia Procedure Notes (Signed)
Procedure Name: MAC Date/Time: 03/10/2022 12:40 PM  Performed by: Tollie Eth, CRNAPre-anesthesia Checklist: Patient identified, Emergency Drugs available, Suction available and Patient being monitored Patient Re-evaluated:Patient Re-evaluated prior to induction Oxygen Delivery Method: Nasal cannula Induction Type: IV induction Placement Confirmation: positive ETCO2

## 2022-03-10 NOTE — Op Note (Signed)
[facility] Gastroenterology Patient Name: Dwayne Hunt Procedure Date: 03/10/2022 12:35 PM MRN: [id number] Account #: [id number] Date of Birth: 25-May-1955 Admit Type: Outpatient Age: 66 Room: [facility] ENDO ROOM 3 Gender: Male Note Status: Finalized Instrument Name: Upper Endoscope [id number] Procedure:             Upper GI endoscopy Indications:           Heartburn, Follow-up of gastric polyps Providers:             Lucilla Lame MD, MD Referring MD:          Leona Carry. Hall Busing, MD (Referring MD) Medicines:             Propofol per Anesthesia Complications:         No immediate complications. Procedure:             Pre-Anesthesia Assessment:                        - Prior to the procedure, a History and Physical was                         performed, and patient medications and allergies were                         reviewed. The patient's tolerance of previous                         anesthesia was also reviewed. The risks and benefits                         of the procedure and the sedation options and risks                         were discussed with the patient. All questions were                         answered, and informed consent was obtained. Prior                         Anticoagulants: The patient has taken no anticoagulant                         or antiplatelet agents. ASA Grade Assessment: II - A                         patient with mild systemic disease. After reviewing                         the risks and benefits, the patient was deemed in                         satisfactory condition to undergo the procedure.                        After obtaining informed consent, the endoscope was                         passed under direct vision. Throughout the procedure,  the patient's blood pressure, pulse, and oxygen                         saturations were monitored continuously. The Endoscope                         was introduced through  the mouth, and advanced to the                         second part of duodenum. The upper GI endoscopy was                         accomplished without difficulty. The patient tolerated                         the procedure well. Findings:      The examined esophagus was normal.      Multiple pedunculated and sessile polyps with no bleeding and no       stigmata of recent bleeding were found in the gastric fundus. The polyp       was removed with a hot snare. Resection and retrieval were complete. To       prevent bleeding post-intervention, one hemostatic clip was successfully       placed (MR conditional). There was no bleeding at the end of the       procedure.      The examined duodenum was normal. Impression:            - Normal esophagus.                        - Multiple gastric polyps. Resected and retrieved.                         Clip (MR conditional) was placed.                        - Normal examined duodenum. Recommendation:        - Discharge patient to home.                        - Resume previous diet.                        - Continue present medications.                        - Await pathology results.                        - Perform a colonoscopy today. Procedure Code(s):     --- Professional ---                        [phone], Esophagogastroduodenoscopy, flexible,                         transoral; with removal of tumor(s), polyp(s), or                         other lesion(s) by snare technique Diagnosis Code(s):     --- Professional ---  R12, Heartburn                        K31.7, Polyp of stomach and duodenum CPT copyright 2022 American Medical Association. All rights reserved. The codes documented in this report are preliminary and upon coder review may  be revised to meet current compliance requirements. Lucilla Lame MD, MD 03/10/2022 12:59:21 PM This report has been signed electronically. Number of Addenda: 0 Note Initiated On:  03/10/2022 12:35 PM Estimated Blood Loss:  Estimated blood loss: none.      [facility]

## 2022-03-10 NOTE — Anesthesia Preprocedure Evaluation (Addendum)
Anesthesia Evaluation  Patient identified by MRN, date of birth, ID band Patient awake    Reviewed: Allergy & Precautions, H&P , NPO status , Patient's Chart, lab work & pertinent test results, reviewed documented beta blocker date and time   History of Anesthesia Complications Negative for: history of anesthetic complications  Airway Mallampati: II  TM Distance: >3 FB Neck ROM: full    Dental no notable dental hx.    Pulmonary neg sleep apnea, neg COPD, Patient abstained from smoking.Not current smoker, former smoker   Pulmonary exam normal breath sounds clear to auscultation       Cardiovascular Exercise Tolerance: Good METShypertension, Pt. on medications (-) CAD and (-) Past MI (-) dysrhythmias  Rhythm:regular Rate:Normal     Neuro/Psych negative neurological ROS  negative psych ROS   GI/Hepatic Neg liver ROS,GERD  Controlled,,  Endo/Other  negative endocrine ROSneg diabetes    Renal/GU negative Renal ROS  negative genitourinary   Musculoskeletal   Abdominal   Peds  Hematology negative hematology ROS (+)   Anesthesia Other Findings Past Medical History: No date: GERD (gastroesophageal reflux disease) No date: Gout No date: Hypertension 2016: Melanoma ([place])     Comment:  left shoulder  Reproductive/Obstetrics negative OB ROS                             Anesthesia Physical Anesthesia Plan  ASA: 2  Anesthesia Plan: General   Post-op Pain Management: Minimal or no pain anticipated   Induction: Intravenous  PONV Risk Score and Plan: 2 and Ondansetron and Propofol infusion  Airway Management Planned: Nasal Cannula  Additional Equipment: None  Intra-op Plan:   Post-operative Plan:   Informed Consent: I have reviewed the patients History and Physical, chart, labs and discussed the procedure including the risks, benefits and alternatives for the proposed anesthesia with the  patient or authorized representative who has indicated his/her understanding and acceptance.     Dental advisory given  Plan Discussed with: CRNA  Anesthesia Plan Comments: (Discussed risks of anesthesia with patient, including possibility of difficulty with spontaneous ventilation under anesthesia necessitating airway intervention, PONV, and rare risks such as cardiac or respiratory or neurological events, and allergic reactions. Discussed the role of CRNA in patient's perioperative care. Patient understands.)        Anesthesia Quick Evaluation

## 2022-03-10 NOTE — H&P (Signed)
Lucilla Lame, MD [facility] [address] [place], [place] [address] [phone] Fax : [phone]  Primary Care Physician:  [name], MD Primary Gastroenterologist:  Dr. Allen Norris  Pre-Procedure History & Physical: HPI:  Dwayne Hunt is a 66 y.o. male is here for an endoscopy and colonoscopy.   Past Medical History:  Diagnosis Date   GERD (gastroesophageal reflux disease)    Gout    Hypertension    Melanoma ([place]) 2016   left shoulder    Past Surgical History:  Procedure Laterality Date   BIOPSY SHOULDER Right 02/10/2016   invasive melanoma   COLONOSCOPY  2012   COLONOSCOPY WITH PROPOFOL N/A 02/16/2017   Procedure: COLONOSCOPY WITH PROPOFOL;  Surgeon: Lucilla Lame, MD;  Location: [place];  Service: Endoscopy;  Laterality: N/A;   ESOPHAGOGASTRODUODENOSCOPY (EGD) WITH PROPOFOL N/A 02/16/2017   Procedure: ESOPHAGOGASTRODUODENOSCOPY (EGD) WITH PROPOFOL;  Surgeon: Lucilla Lame, MD;  Location: [place];  Service: Endoscopy;  Laterality: N/A;   MELANOMA EXCISION Left 2016   Dr Aubery Lapping   POLYPECTOMY  02/16/2017   Procedure: POLYPECTOMY;  Surgeon: Lucilla Lame, MD;  Location: [place];  Service: Endoscopy;;   UPPER GI ENDOSCOPY  2012    Prior to Admission medications   Medication Sig Start Date End Date Taking? Authorizing Provider  allopurinol (ZYLOPRIM) 300 MG tablet Take 300 mg by mouth daily. 07/13/15  Yes [provider]  ALPRAZolam Duanne Moron) 1 MG tablet Take 0.5 mg by mouth at bedtime as needed for anxiety.   Yes [provider]  lisinopril (PRINIVIL,ZESTRIL) 20 MG tablet Take 20 mg by mouth daily. 07/13/15  Yes [provider]  pantoprazole (PROTONIX) 40 MG tablet Take 40 mg by mouth daily.   Yes [provider]  clobetasol cream (TEMOVATE) 6.72 % Apply 1 application topically 2 (two) times daily. 12/05/19   [provider]  DEXILANT 60 MG capsule Take 1 capsule by mouth daily. Patient  not taking: Reported on 03/01/2022 12/06/19   [provider]  Diclofenac Sodium (PENNSAID) 2 % SOLN Place 2 application 2 (two) times daily onto the skin. Patient not taking: Reported on 03/01/2022 02/14/17   Lyndal Pulley, DO  fluticasone [facility]) 50 MCG/ACT nasal spray SPRAY 2 SPRAYS INTO EACH NOSTRIL EVERY DAY 08/17/20   Lyndal Pulley, DO  meloxicam (MOBIC) 15 MG tablet TAKE 1 TABLET BY MOUTH EVERY DAY Patient not taking: Reported on 03/01/2022 06/19/19   Lyndal Pulley, DO  tadalafil (CIALIS) 20 MG tablet  03/20/15   [provider]  Testosterone Cypionate 100 MG/ML SOLN Inject as directed.    [provider]  Vitamin D, Ergocalciferol, (DRISDOL) 1.25 MG (50000 UT) CAPS capsule Take 1 capsule (50,000 Units total) by mouth every 7 (seven) days. Patient not taking: Reported on 03/01/2022 05/16/18   Lyndal Pulley, DO    Allergies as of 02/16/2022 - Review Complete 01/27/2020  Allergen Reaction Noted   Amoxicillin Rash 11/17/2016    Family History  Problem Relation Age of Onset   Ovarian cancer Mother    Leukemia Father     Social History   Socioeconomic History   Marital status: Divorced    Spouse name: Not on file   Number of children: Not on file   Years of education: Not on file   Highest education level: Not on file  Occupational History   Not on file  Tobacco Use   Smoking status: Former    Years: 10.00  Types: Cigarettes    Quit date: 04/11/2005    Years since quitting: 16.9   Smokeless tobacco: Never  Vaping Use   Vaping Use: Never used  Substance and Sexual Activity   Alcohol use: Yes    Alcohol/week: 14.0 standard drinks of alcohol    Types: 14 Shots of liquor per week    Comment: 2/day   Drug use: No   Sexual activity: Not on file  Other Topics Concern   Not on file  Social History Narrative   Not on file   Social Determinants of Health   Financial Resource Strain: Not on file  Food Insecurity: Not on file   Transportation Needs: Not on file  Physical Activity: Not on file  Stress: Not on file  Social Connections: Not on file  Intimate Partner Violence: Not on file    Review of Systems: See HPI, otherwise negative ROS  Physical Exam: BP (!) 155/79   Pulse 61   Temp (!) 96.8 F (36 C) (Temporal)   Resp 18   Ht 6' (1.829 m)   Wt 89.8 kg   SpO2 98%   BMI 26.85 kg/m  General:   Alert,  pleasant and cooperative in NAD Head:  Normocephalic and atraumatic. Neck:  Supple; no masses or thyromegaly. Lungs:  Clear throughout to auscultation.    Heart:  Regular rate and rhythm. Abdomen:  Soft, nontender and nondistended. Normal bowel sounds, without guarding, and without rebound.   Neurologic:  Alert and  oriented x4;  grossly normal neurologically.  Impression/Plan: Dwayne Hunt is here for an endoscopy and colonoscopy to be performed for GERD gastric polyps and a history of adenomatous polyps on 2018   Risks, benefits, limitations, and alternatives regarding  endoscopy and colonoscopy have been reviewed with the patient.  Questions have been answered.  All parties agreeable.   Lucilla Lame, MD  03/10/2022, 12:12 PM

## 2022-03-10 NOTE — Transfer of Care (Signed)
Immediate Anesthesia Transfer of Care Note  Patient: Dwayne Hunt  Procedure(s) Performed: COLONOSCOPY WITH PROPOFOL ESOPHAGOGASTRODUODENOSCOPY (EGD) WITH PROPOFOL  Patient Location: Endoscopy Unit  Anesthesia Type:General  Level of Consciousness: drowsy  Airway & Oxygen Therapy: Patient Spontanous Breathing and Patient connected to nasal cannula oxygen  Post-op Assessment: Report given to RN and Post -op Vital signs reviewed and stable  Post vital signs: Reviewed  Last Vitals:  Vitals Value Taken Time  BP    Temp    Pulse    Resp    SpO2      Last Pain:  Vitals:   03/10/22 1159  TempSrc: Temporal         Complications: No notable events documented.

## 2022-03-11 ENCOUNTER — Encounter: Payer: Self-pay | Admitting: Gastroenterology

## 2022-03-11 LAB — SURGICAL PATHOLOGY

## 2022-03-19 ENCOUNTER — Encounter: Payer: Self-pay | Admitting: Gastroenterology

## 2022-11-29 ENCOUNTER — Other Ambulatory Visit: Payer: Self-pay | Admitting: Orthopedic Surgery

## 2022-11-29 DIAGNOSIS — M25511 Pain in right shoulder: Secondary | ICD-10-CM

## 2022-12-16 ENCOUNTER — Ambulatory Visit
Admission: RE | Admit: 2022-12-16 | Discharge: 2022-12-16 | Disposition: A | Discharge status: Home / Self Care | Payer: Medicare HMO | Source: Ambulatory Visit | Attending: Orthopedic Surgery | Admitting: Orthopedic Surgery

## 2022-12-16 DIAGNOSIS — M25511 Pain in right shoulder: Secondary | ICD-10-CM

## 2024-01-08 ENCOUNTER — Ambulatory Visit (INDEPENDENT_AMBULATORY_CARE_PROVIDER_SITE_OTHER): Admitting: Urology

## 2024-01-08 ENCOUNTER — Encounter: Payer: Self-pay | Admitting: Urology

## 2024-01-08 VITALS — BP 182/97 | HR 60 | Ht 72.0 in | Wt 195.0 lb

## 2024-01-08 DIAGNOSIS — N5201 Erectile dysfunction due to arterial insufficiency: Secondary | ICD-10-CM | POA: Diagnosis not present

## 2024-01-08 DIAGNOSIS — N400 Enlarged prostate without lower urinary tract symptoms: Secondary | ICD-10-CM | POA: Diagnosis not present

## 2024-01-08 DIAGNOSIS — Z8639 Personal history of other endocrine, nutritional and metabolic disease: Secondary | ICD-10-CM | POA: Diagnosis not present

## 2024-01-08 DIAGNOSIS — N401 Enlarged prostate with lower urinary tract symptoms: Secondary | ICD-10-CM

## 2024-01-08 LAB — URINALYSIS, COMPLETE
Bilirubin, UA: NEGATIVE
Glucose, UA: NEGATIVE
Ketones, UA: NEGATIVE
Leukocytes,UA: NEGATIVE
Nitrite, UA: NEGATIVE
Protein,UA: NEGATIVE
RBC, UA: NEGATIVE
Specific Gravity, UA: 1.02 (ref 1.005–1.030)
Urobilinogen, Ur: 0.2 mg/dL (ref 0.2–1.0)
pH, UA: 6 (ref 5.0–7.5)

## 2024-01-08 LAB — MICROSCOPIC EXAMINATION

## 2024-01-08 LAB — BLADDER SCAN AMB NON-IMAGING

## 2024-01-08 NOTE — Progress Notes (Signed)
 01/08/2024 1:50 PM   Dwayne Hunt 07-05-1955 [id number]  Referring provider: Fernande Ophelia JINNY DOUGLAS, MD [address] [place],  [place] [address]  Chief Complaint  Patient presents with   Benign Prostatic Hypertrophy    HPI: Dwayne Hunt is a 68 y.o. male who presents to establish local urologic care.  Previously followed by several different urologist over the years for BPH, hypogonadism and erectile dysfunction He most recently had seen Dr. Janit in [place].  He was on testosterone cypionate injections and tadalafil for ED His longtime girlfriend has dementia and he is no longer sexually active and has discontinued the Cialis and testosterone No bothersome lower urinary tract symptoms.   PSA 10/11/2023 was elevated above baseline at 3.15.  It was repeated 11/13/23 and was back to baseline at 1.24   PMH: Past Medical History:  Diagnosis Date   GERD (gastroesophageal reflux disease)    Gout    Hypertension    Melanoma ([facility]) 2016   left shoulder    Surgical History: Past Surgical History:  Procedure Laterality Date   BIOPSY SHOULDER Right 02/10/2016   invasive melanoma   COLONOSCOPY  2012   COLONOSCOPY WITH PROPOFOL  N/A 02/16/2017   Procedure: COLONOSCOPY WITH PROPOFOL ;  Surgeon: Jinny Carmine, MD;  Location: [facility];  Service: Endoscopy;  Laterality: N/A;   COLONOSCOPY WITH PROPOFOL  N/A 03/10/2022   Procedure: COLONOSCOPY WITH PROPOFOL ;  Surgeon: Jinny Carmine, MD;  Location: [facility];  Service: Endoscopy;  Laterality: N/A;   ESOPHAGOGASTRODUODENOSCOPY (EGD) WITH PROPOFOL  N/A 02/16/2017   Procedure: ESOPHAGOGASTRODUODENOSCOPY (EGD) WITH PROPOFOL ;  Surgeon: Jinny Carmine, MD;  Location: [facility];  Service: Endoscopy;  Laterality: N/A;   ESOPHAGOGASTRODUODENOSCOPY (EGD) WITH PROPOFOL  N/A 03/10/2022   Procedure: ESOPHAGOGASTRODUODENOSCOPY (EGD) WITH PROPOFOL ;  Surgeon: Jinny Carmine, MD;  Location: [facility];  Service:  Endoscopy;  Laterality: N/A;   MELANOMA EXCISION Left 2016   Dr Cathlyn   POLYPECTOMY  02/16/2017   Procedure: POLYPECTOMY;  Surgeon: Jinny Carmine, MD;  Location: [facility];  Service: Endoscopy;;   UPPER GI ENDOSCOPY  2012    Home Medications:  Allergies as of 01/08/2024       Reactions   Amoxicillin Rash        Medication List        Accurate as of January 08, 2024  1:50 PM. If you have any questions, ask your nurse or doctor.          allopurinol 300 MG tablet Commonly known as: ZYLOPRIM Take 300 mg by mouth daily.   ALPRAZolam 1 MG tablet Commonly known as: XANAX Take 0.5 mg by mouth at bedtime as needed for anxiety.   Cialis 20 MG tablet Generic drug: tadalafil   clobetasol cream 0.05 % Commonly known as: TEMOVATE Apply 1 application topically 2 (two) times daily.   Dexilant 60 MG capsule Generic drug: dexlansoprazole Take 1 capsule by mouth daily.   Diclofenac  Sodium 2 % Soln Commonly known as: Pennsaid  Place 2 application 2 (two) times daily onto the skin.   fluticasone  50 MCG/ACT nasal spray Commonly known as: FLONASE  SPRAY 2 SPRAYS INTO EACH NOSTRIL EVERY DAY   lisinopril 20 MG tablet Commonly known as: ZESTRIL Take 20 mg by mouth daily.   meloxicam  15 MG tablet Commonly known as: MOBIC  TAKE 1 TABLET BY MOUTH EVERY DAY   pantoprazole 40 MG tablet Commonly known as: PROTONIX Take 40 mg by mouth daily.   Testosterone Cypionate 100 MG/ML Soln  Inject as directed.   Vitamin D  (Ergocalciferol ) 1.25 MG (50000 UNIT) Caps capsule Commonly known as: DRISDOL  Take 1 capsule (50,000 Units total) by mouth every 7 (seven) days.        Allergies:  Allergies  Allergen Reactions   Amoxicillin Rash    Family History: Family History  Problem Relation Age of Onset   Ovarian cancer Mother    Leukemia Father     Social History:  reports that he quit smoking about 18 years ago. His smoking use included cigarettes. He started  smoking about 28 years ago. He has never used smokeless tobacco. He reports current alcohol use of about 14.0 standard drinks of alcohol per week. He reports that he does not use drugs.   Physical Exam: BP (!) 182/97   Pulse 60   Ht 6' (1.829 m)   Wt 195 lb (88.5 kg)   BMI 26.45 kg/m   Constitutional:  Alert, No acute distress. HEENT: [place] AT Respiratory: Normal respiratory effort, no increased work of breathing. GU: Prostate 40 g, smooth without nodules Psychiatric: Normal mood and affect.  Laboratory:  Urinalysis: Dipstick/microscopy negative   Assessment & Plan:    1.  BPH with mild LUTS No bothersome LUTS PVR today 40 mL Desires annual follow-up for DRE  2.  History of hypogonadism No longer on TRT  3.  Erectile dysfunction Not presently sexually active    Glendia JAYSON Barba, MD  [facility] [address], [address] [place], [place] [address] [phone]

## 2024-01-30 ENCOUNTER — Other Ambulatory Visit: Payer: Self-pay | Admitting: Internal Medicine

## 2024-01-30 DIAGNOSIS — I10 Essential (primary) hypertension: Secondary | ICD-10-CM

## 2024-01-30 DIAGNOSIS — I2089 Other forms of angina pectoris: Secondary | ICD-10-CM

## 2024-02-13 ENCOUNTER — Encounter (HOSPITAL_COMMUNITY): Payer: Self-pay

## 2024-02-15 ENCOUNTER — Ambulatory Visit
Admission: RE | Admit: 2024-02-15 | Discharge: 2024-02-15 | Disposition: A | Discharge status: Home / Self Care | Source: Ambulatory Visit | Attending: Internal Medicine | Admitting: Internal Medicine

## 2024-02-15 DIAGNOSIS — I10 Essential (primary) hypertension: Secondary | ICD-10-CM | POA: Diagnosis present

## 2024-02-15 DIAGNOSIS — I2089 Other forms of angina pectoris: Secondary | ICD-10-CM | POA: Diagnosis present

## 2024-02-15 MED ORDER — NITROGLYCERIN 0.4 MG SL SUBL
SUBLINGUAL_TABLET | SUBLINGUAL | Status: AC
Start: 2024-02-15 — End: 2024-02-15
  Filled 2024-02-15: qty 2

## 2024-02-15 MED ORDER — IOHEXOL 350 MG/ML SOLN
100.0000 mL | Freq: Once | INTRAVENOUS | Status: AC | PRN
  Administered 2024-02-15: 100 mL via INTRAVENOUS

## 2024-02-15 MED ORDER — NITROGLYCERIN 0.4 MG SL SUBL
0.8000 mg | SUBLINGUAL_TABLET | Freq: Once | SUBLINGUAL | Status: AC
  Administered 2024-02-15: 0.8 mg via SUBLINGUAL

## 2024-02-15 NOTE — Progress Notes (Signed)
 Patient tolerated procedure well. W/C to lobby.  Ambulate w/o difficulty. Denies light headedness or being dizzy. Encouraged to drink extra water today and reasoning explained. Verbalized understanding. All questions answered. ABC intact. No further needs. Discharge from procedure area w/o issues.

## 2025-01-07 ENCOUNTER — Ambulatory Visit: Admitting: Urology
